# Patient Record
Sex: Male | Born: 1955 | Race: White | Hispanic: No | Marital: Married | Smoking: Current every day smoker
Health system: Southern US, Community
[De-identification: ages and names within clinical notes are randomized; demographics above are authoritative.]

## PROBLEM LIST (undated history)

## (undated) DIAGNOSIS — E785 Hyperlipidemia, unspecified: Secondary | ICD-10-CM

## (undated) DIAGNOSIS — Z91199 Patient's noncompliance with other medical treatment and regimen due to unspecified reason: Secondary | ICD-10-CM

## (undated) DIAGNOSIS — E119 Type 2 diabetes mellitus without complications: Secondary | ICD-10-CM

## (undated) DIAGNOSIS — I2510 Atherosclerotic heart disease of native coronary artery without angina pectoris: Secondary | ICD-10-CM

## (undated) DIAGNOSIS — Z720 Tobacco use: Secondary | ICD-10-CM

## (undated) DIAGNOSIS — I10 Essential (primary) hypertension: Secondary | ICD-10-CM

## (undated) DIAGNOSIS — I2699 Other pulmonary embolism without acute cor pulmonale: Secondary | ICD-10-CM

## (undated) DIAGNOSIS — F419 Anxiety disorder, unspecified: Secondary | ICD-10-CM

## (undated) DIAGNOSIS — M1990 Unspecified osteoarthritis, unspecified site: Secondary | ICD-10-CM

## (undated) DIAGNOSIS — I219 Acute myocardial infarction, unspecified: Secondary | ICD-10-CM

## (undated) HISTORY — DX: Hyperlipidemia, unspecified: E78.5

## (undated) HISTORY — PX: SHOULDER SURGERY: SHX246

## (undated) HISTORY — DX: Unspecified osteoarthritis, unspecified site: M19.90

## (undated) HISTORY — PX: BACK SURGERY: SHX140

## (undated) HISTORY — DX: Atherosclerotic heart disease of native coronary artery without angina pectoris: I25.10

## (undated) HISTORY — PX: COLONOSCOPY: SHX174

## (undated) HISTORY — DX: Tobacco use: Z72.0

## (undated) HISTORY — DX: Patient's noncompliance with other medical treatment and regimen due to unspecified reason: Z91.199

## (undated) HISTORY — DX: Anxiety disorder, unspecified: F41.9

## (undated) HISTORY — PX: REPLACEMENT TOTAL KNEE: SUR1224

## (undated) HISTORY — PX: OTHER SURGICAL HISTORY: SHX169

---

## 2015-11-11 DIAGNOSIS — M5010 Cervical disc disorder with radiculopathy, unspecified cervical region: Secondary | ICD-10-CM | POA: Diagnosis not present

## 2015-11-16 DIAGNOSIS — M0609 Rheumatoid arthritis without rheumatoid factor, multiple sites: Secondary | ICD-10-CM | POA: Diagnosis not present

## 2015-11-30 DIAGNOSIS — M50323 Other cervical disc degeneration at C6-C7 level: Secondary | ICD-10-CM | POA: Diagnosis not present

## 2015-11-30 DIAGNOSIS — M50123 Cervical disc disorder at C6-C7 level with radiculopathy: Secondary | ICD-10-CM | POA: Diagnosis not present

## 2015-12-21 DIAGNOSIS — M0609 Rheumatoid arthritis without rheumatoid factor, multiple sites: Secondary | ICD-10-CM | POA: Diagnosis not present

## 2015-12-22 DIAGNOSIS — M5010 Cervical disc disorder with radiculopathy, unspecified cervical region: Secondary | ICD-10-CM | POA: Diagnosis not present

## 2015-12-22 DIAGNOSIS — M47812 Spondylosis without myelopathy or radiculopathy, cervical region: Secondary | ICD-10-CM | POA: Diagnosis not present

## 2015-12-30 DIAGNOSIS — M0609 Rheumatoid arthritis without rheumatoid factor, multiple sites: Secondary | ICD-10-CM | POA: Diagnosis not present

## 2015-12-30 DIAGNOSIS — M79641 Pain in right hand: Secondary | ICD-10-CM | POA: Diagnosis not present

## 2015-12-30 DIAGNOSIS — S63210A Subluxation of metacarpophalangeal joint of right index finger, initial encounter: Secondary | ICD-10-CM | POA: Diagnosis not present

## 2016-01-12 DIAGNOSIS — M47812 Spondylosis without myelopathy or radiculopathy, cervical region: Secondary | ICD-10-CM | POA: Diagnosis not present

## 2016-01-26 DIAGNOSIS — M47812 Spondylosis without myelopathy or radiculopathy, cervical region: Secondary | ICD-10-CM | POA: Diagnosis not present

## 2016-03-20 DIAGNOSIS — M7989 Other specified soft tissue disorders: Secondary | ICD-10-CM | POA: Diagnosis not present

## 2016-03-20 DIAGNOSIS — S86911A Strain of unspecified muscle(s) and tendon(s) at lower leg level, right leg, initial encounter: Secondary | ICD-10-CM | POA: Diagnosis not present

## 2016-03-22 DIAGNOSIS — Z96651 Presence of right artificial knee joint: Secondary | ICD-10-CM | POA: Diagnosis not present

## 2016-03-26 ENCOUNTER — Emergency Department (HOSPITAL_COMMUNITY)
Admission: EM | Admit: 2016-03-26 | Discharge: 2016-03-26 | Disposition: A | Discharge status: Home / Self Care | Payer: Medicare Other | Attending: Emergency Medicine | Admitting: Emergency Medicine

## 2016-03-26 ENCOUNTER — Emergency Department (HOSPITAL_COMMUNITY): Payer: Medicare Other

## 2016-03-26 ENCOUNTER — Encounter (HOSPITAL_COMMUNITY): Payer: Self-pay | Admitting: Emergency Medicine

## 2016-03-26 DIAGNOSIS — E119 Type 2 diabetes mellitus without complications: Secondary | ICD-10-CM | POA: Insufficient documentation

## 2016-03-26 DIAGNOSIS — J849 Interstitial pulmonary disease, unspecified: Secondary | ICD-10-CM | POA: Diagnosis not present

## 2016-03-26 DIAGNOSIS — J9801 Acute bronchospasm: Secondary | ICD-10-CM | POA: Diagnosis not present

## 2016-03-26 DIAGNOSIS — Z96659 Presence of unspecified artificial knee joint: Secondary | ICD-10-CM | POA: Insufficient documentation

## 2016-03-26 DIAGNOSIS — T17908A Unspecified foreign body in respiratory tract, part unspecified causing other injury, initial encounter: Secondary | ICD-10-CM

## 2016-03-26 DIAGNOSIS — J709 Respiratory conditions due to unspecified external agent: Secondary | ICD-10-CM | POA: Diagnosis not present

## 2016-03-26 DIAGNOSIS — R079 Chest pain, unspecified: Secondary | ICD-10-CM | POA: Diagnosis not present

## 2016-03-26 DIAGNOSIS — R0789 Other chest pain: Secondary | ICD-10-CM | POA: Diagnosis not present

## 2016-03-26 DIAGNOSIS — R0781 Pleurodynia: Secondary | ICD-10-CM | POA: Diagnosis present

## 2016-03-26 DIAGNOSIS — I10 Essential (primary) hypertension: Secondary | ICD-10-CM | POA: Diagnosis not present

## 2016-03-26 DIAGNOSIS — R0989 Other specified symptoms and signs involving the circulatory and respiratory systems: Secondary | ICD-10-CM | POA: Diagnosis not present

## 2016-03-26 DIAGNOSIS — I252 Old myocardial infarction: Secondary | ICD-10-CM | POA: Insufficient documentation

## 2016-03-26 DIAGNOSIS — F17210 Nicotine dependence, cigarettes, uncomplicated: Secondary | ICD-10-CM | POA: Insufficient documentation

## 2016-03-26 DIAGNOSIS — S279XXA Injury of unspecified intrathoracic organ, initial encounter: Secondary | ICD-10-CM | POA: Diagnosis not present

## 2016-03-26 DIAGNOSIS — S2242XA Multiple fractures of ribs, left side, initial encounter for closed fracture: Secondary | ICD-10-CM | POA: Diagnosis not present

## 2016-03-26 HISTORY — DX: Essential (primary) hypertension: I10

## 2016-03-26 HISTORY — DX: Other pulmonary embolism without acute cor pulmonale: I26.99

## 2016-03-26 HISTORY — DX: Acute myocardial infarction, unspecified: I21.9

## 2016-03-26 HISTORY — DX: Type 2 diabetes mellitus without complications: E11.9

## 2016-03-26 LAB — I-STAT CHEM 8, ED
BUN: 15 mg/dL (ref 6–20)
CREATININE: 0.5 mg/dL — AB (ref 0.61–1.24)
Calcium, Ion: 1.16 mmol/L (ref 1.12–1.23)
Chloride: 97 mmol/L — ABNORMAL LOW (ref 101–111)
Glucose, Bld: 552 mg/dL (ref 65–99)
HEMATOCRIT: 36 % — AB (ref 39.0–52.0)
HEMOGLOBIN: 12.2 g/dL — AB (ref 13.0–17.0)
Potassium: 4.5 mmol/L (ref 3.5–5.1)
SODIUM: 134 mmol/L — AB (ref 135–145)
TCO2: 23 mmol/L (ref 0–100)

## 2016-03-26 LAB — CBC
HCT: 34.6 % — ABNORMAL LOW (ref 39.0–52.0)
Hemoglobin: 12.5 g/dL — ABNORMAL LOW (ref 13.0–17.0)
MCH: 32 pg (ref 26.0–34.0)
MCHC: 36.1 g/dL — ABNORMAL HIGH (ref 30.0–36.0)
MCV: 88.5 fL (ref 78.0–100.0)
PLATELETS: 262 10*3/uL (ref 150–400)
RBC: 3.91 MIL/uL — ABNORMAL LOW (ref 4.22–5.81)
RDW: 12.6 % (ref 11.5–15.5)
WBC: 6.6 10*3/uL (ref 4.0–10.5)

## 2016-03-26 MED ORDER — DEXAMETHASONE 4 MG PO TABS: 4.0000 mg | ORAL_TABLET | Freq: Once | ORAL | Status: DC

## 2016-03-26 MED ORDER — ALBUTEROL SULFATE HFA 108 (90 BASE) MCG/ACT IN AERS
2.0000 | INHALATION_SPRAY | RESPIRATORY_TRACT | Status: DC | PRN
  Filled 2016-03-26: qty 6.7

## 2016-03-26 MED ORDER — HYDROMORPHONE HCL 1 MG/ML IJ SOLN
1.0000 mg | Freq: Once | INTRAMUSCULAR | Status: AC
  Administered 2016-03-26: 1 mg via INTRAVENOUS
  Filled 2016-03-26: qty 1

## 2016-03-26 MED ORDER — INSULIN ASPART 100 UNIT/ML ~~LOC~~ SOLN
10.0000 [IU] | Freq: Once | SUBCUTANEOUS | Status: AC
  Administered 2016-03-26: 10 [IU] via INTRAVENOUS
  Filled 2016-03-26: qty 1

## 2016-03-26 MED ORDER — HYDROMORPHONE HCL 1 MG/ML IJ SOLN
1.0000 mg | INTRAMUSCULAR | Status: DC | PRN
  Administered 2016-03-26: 1 mg via INTRAVENOUS
  Filled 2016-03-26: qty 1

## 2016-03-26 MED ORDER — OXYCODONE-ACETAMINOPHEN 5-325 MG PO TABS: 1.0000 | ORAL_TABLET | ORAL | Status: DC | PRN

## 2016-03-26 MED ORDER — IOPAMIDOL (ISOVUE-300) INJECTION 61%
75.0000 mL | Freq: Once | INTRAVENOUS | Status: AC | PRN
  Administered 2016-03-26: 75 mL via INTRAVENOUS

## 2016-03-26 MED ORDER — DEXAMETHASONE SODIUM PHOSPHATE 10 MG/ML IJ SOLN
10.0000 mg | Freq: Once | INTRAMUSCULAR | Status: AC
  Administered 2016-03-26: 10 mg via INTRAVENOUS
  Filled 2016-03-26: qty 1

## 2016-03-26 MED ORDER — LEVOFLOXACIN 500 MG PO TABS: 500.0000 mg | ORAL_TABLET | Freq: Every day | ORAL | Status: DC

## 2016-03-26 MED ORDER — IBUPROFEN 600 MG PO TABS: 600.0000 mg | ORAL_TABLET | Freq: Three times a day (TID) | ORAL | Status: DC | PRN

## 2016-03-26 MED ORDER — LEVOFLOXACIN 500 MG PO TABS
500.0000 mg | ORAL_TABLET | Freq: Once | ORAL | Status: AC
  Administered 2016-03-26: 500 mg via ORAL
  Filled 2016-03-26: qty 1

## 2016-03-26 MED ORDER — ALBUTEROL SULFATE (2.5 MG/3ML) 0.083% IN NEBU
5.0000 mg | INHALATION_SOLUTION | Freq: Once | RESPIRATORY_TRACT | Status: AC
  Administered 2016-03-26: 5 mg via RESPIRATORY_TRACT
  Filled 2016-03-26: qty 6

## 2016-03-26 NOTE — ED Notes (Signed)
Per EMS patient comes from [facility] for left ribcage pain.  Patient aspirated on cereal this am.  Patient aggravated old injury from a fall few days ago when coughing. Pt brought Chest and Rib CD from urgent care.

## 2016-03-26 NOTE — Discharge Instructions (Signed)
Bronchospasm, Adult  A bronchospasm is a spasm or tightening of the airways going into the lungs. During a bronchospasm breathing becomes more difficult because the airways get smaller. When this happens there can be coughing, a whistling sound when breathing (wheezing), and difficulty breathing. Bronchospasm is often associated with asthma, but not all patients who experience a bronchospasm have asthma.  CAUSES   A bronchospasm is caused by inflammation or irritation of the airways. The inflammation or irritation may be triggered by:   · Allergies (such as to animals, pollen, food, or mold). Allergens that cause bronchospasm may cause wheezing immediately after exposure or many hours later.    · Infection. Viral infections are believed to be the most common cause of bronchospasm.    · Exercise.    · Irritants (such as pollution, cigarette smoke, strong odors, aerosol sprays, and paint fumes).    · Weather changes. Winds increase molds and pollens in the air. Rain refreshes the air by washing irritants out. Cold air may cause inflammation.    · Stress and emotional upset.    SIGNS AND SYMPTOMS   · Wheezing.    · Excessive nighttime coughing.    · Frequent or severe coughing with a simple cold.    · Chest tightness.    · Shortness of breath.    DIAGNOSIS   Bronchospasm is usually diagnosed through a history and physical exam. Tests, such as chest X-rays, are sometimes done to look for other conditions.  TREATMENT   · Inhaled medicines can be given to open up your airways and help you breathe. The medicines can be given using either an inhaler or a nebulizer machine.  · Corticosteroid medicines may be given for severe bronchospasm, usually when it is associated with asthma.  HOME CARE INSTRUCTIONS   · Always have a plan prepared for seeking medical care. Know when to call your health care provider and local emergency services (911 in the [place] Know where you can access local emergency care.  · Only take medicines as  directed by your health care provider.  · If you were prescribed an inhaler or nebulizer machine, ask your health care provider to explain how to use it correctly. Always use a spacer with your inhaler if you were given one.  · It is necessary to remain calm during an attack. Try to relax and breathe more slowly.   · Control your home environment in the following ways:      Change your heating and air conditioning filter at least once a month.      Limit your use of fireplaces and wood stoves.    Do not smoke and do not allow smoking in your home.      Avoid exposure to perfumes and fragrances.      Get rid of pests (such as roaches and mice) and their droppings.      Throw away plants if you see mold on them.      Keep your house clean and dust free.      Replace carpet with wood, tile, or vinyl flooring. Carpet can trap dander and dust.      Use allergy-proof pillows, mattress covers, and box spring covers.      Wash bed sheets and blankets every week in hot water and dry them in a dryer.      Use blankets that are made of polyester or cotton.      Wash hands frequently.  SEEK MEDICAL CARE IF:   · You have muscle aches.    · You have chest pain.    · The sputum changes from clear or   white to yellow, green, gray, or bloody.    · The sputum you cough up gets thicker.    · There are problems that may be related to the medicine you are given, such as a rash, itching, swelling, or trouble breathing.    SEEK IMMEDIATE MEDICAL CARE IF:   · You have worsening wheezing and coughing even after taking your prescribed medicines.    · You have increased difficulty breathing.    · You develop severe chest pain.  MAKE SURE YOU:   · Understand these instructions.  · Will watch your condition.  · Will get help right away if you are not doing well or get worse.     This information is not intended to replace advice given to you by your health care provider. Make sure you discuss any questions you have with your health care  provider.     Document Released: 10/19/2003 Document Revised: 11/06/2014 Document Reviewed: 04/07/2013  Elsevier Interactive Patient Education ©2016 [employer]

## 2016-03-26 NOTE — ED Provider Notes (Signed)
CSN: [id number]     Arrival date & time 03/26/16  1215 History   First MD Initiated Contact with Patient 03/26/16 1236     Chief Complaint  Patient presents with  . rib cage pain       HPI Patient presents to the emergency department with complaints of left lateral chest pain worse with coughing and movement since choking on his cereal this morning.  He thinks he may have aspirated some of his morning cereal.  He reports no shortness of breath at this time but reports severe pain in his left lateral chest when he coughs or moves.  He was seen in urgent care and they suspected a left-sided rib fracture and thus just sent him to the ER for evaluation.  Patient continues to smoke cigarettes.  He does have a history of pulmonary and wasn't but is no longer on anticoagulants.  He denies unilateral leg swelling.  He reports no shortness of breath.  He did fall several days ago resulting in him landing on his back on his back and suspect that he may have initially injured his left lateral chest at that time during the fall as he had some mild pain then.  Since his severe episode of coughing this morning he feels as though he exacerbated his left lateral chest pain.   Past Medical History  Diagnosis Date  . PE (pulmonary embolism)   . MI (myocardial infarction) (HCC)   . Hypertension   . Diabetes mellitus without complication [facility])    Past Surgical History  Procedure Laterality Date  . Replacement total knee    . Neck fusion    . Back surgery    . Shoulder surgery     No family history on file. Social History  Substance Use Topics  . Smoking status: Current Every Day Smoker    Types: Cigarettes  . Smokeless tobacco: None  . Alcohol Use: No    Review of Systems  All other systems reviewed and are negative.     Allergies  Review of patient's allergies indicates no known allergies.  Home Medications   Prior to Admission medications   Not on File   BP 112/77 mmHg  Pulse 67   Temp(Src) 98.1 F (36.7 C) (Oral)  Resp 18  SpO2 99% Physical Exam  Constitutional: He is oriented to person, place, and time. He appears well-developed and well-nourished.  HENT:  Head: Normocephalic and atraumatic.  Eyes: EOM are normal.  Neck: Normal range of motion.  Cardiovascular: Normal rate, regular rhythm, normal heart sounds and intact distal pulses.   Pulmonary/Chest: Effort normal and breath sounds normal. No respiratory distress.  Left lateral chest wall tenderness  Abdominal: Soft. He exhibits no distension. There is no tenderness.  Musculoskeletal: Normal range of motion.  Neurological: He is alert and oriented to person, place, and time.  Skin: Skin is warm and dry.  Psychiatric: He has a normal mood and affect. Judgment normal.  Nursing note and vitals reviewed.   ED Course  Procedures (including critical care time) Labs Review Labs Reviewed  CBC - Abnormal; Notable for the following:    RBC 3.91 (*)    Hemoglobin 12.5 (*)    HCT 34.6 (*)    MCHC 36.1 (*)    All other components within normal limits  I-STAT CHEM 8, ED - Abnormal; Notable for the following:    Sodium 134 (*)    Chloride 97 (*)    Creatinine, Ser 0.50 (*)  Glucose, Bld 552 (*)    Hemoglobin 12.2 (*)    HCT 36.0 (*)    All other components within normal limits    Imaging Review Dg Chest 2 View  03/26/2016  CLINICAL DATA:  Patient status post aspiration. EXAM: CHEST  2 VIEW COMPARISON:  None. FINDINGS: Normal cardiac contours status post median sternotomy. Anterior cervical spinal fusion hardware. There is a 7 cm oval circumscribed mass projecting anterior to the mediastinum on lateral view and overlying the right hilum on the frontal view. No consolidative pulmonary opacities. No pleural effusion or pneumothorax. Multilevel thoracic spine degenerative changes. IMPRESSION: Nonspecific 7 cm anterior mediastinal mass. This needs dedicated evaluation with contrast-enhanced chest CT. No acute  cardiopulmonary process. Electronically Signed   By: Annia Belt M.D.   On: 03/26/2016 13:51   Ct Chest W Contrast  03/26/2016  CLINICAL DATA:  60 year old male with abnormal chest CT concerning for potential anterior mediastinal mass. EXAM: CT CHEST WITH CONTRAST TECHNIQUE: Multidetector CT imaging of the chest was performed during intravenous contrast administration. CONTRAST:  85mL ISOVUE-300 IOPAMIDOL (ISOVUE-300) INJECTION 61% COMPARISON:  No priors.  Chest x-ray 03/26/2016. FINDINGS: Mediastinum/Lymph Nodes: Heart size is borderline enlarged. There is no significant pericardial fluid, thickening or pericardial calcification. There is atherosclerosis of the thoracic aorta, the great vessels of the mediastinum and the coronary arteries, including calcified atherosclerotic plaque in the left anterior descending, left circumflex and right coronary arteries. Status post median sternotomy for CABG, including LIMA to the LAD. No pathologically enlarged mediastinal or hilar lymph nodes. Finding on the recent chest radiograph corresponds to a fatty attenuation lesion in the right-side of the anterior mediastinum which measures up to 4.3 x 4.1 x 4.5 cm. This is associated with some surgical clips and an abandoned epicardial pacing lead, and is favored to represent an area of postoperative fat necrosis in the anterior mediastinum. Esophagus is unremarkable in appearance. No axillary lymphadenopathy. Lungs/Pleura: Mild diffuse centrilobular ground-glass attenuation micronodularity, suggestive of a smoking related disease. No larger more suspicious appearing pulmonary nodules or masses. No acute consolidative airspace disease. Mild diffuse bronchial thickening with mild paraseptal emphysema. Upper Abdomen: 7 mm partially calcified gallstone lying dependently in the neck of the gallbladder. Musculoskeletal/Soft Tissues: Median sternotomy wires. There are no aggressive appearing lytic or blastic lesions noted in the  visualized portions of the skeleton. IMPRESSION: 1. Finding on the recent chest radiograph corresponds to a benign-appearing fatty attenuation lesion in the right-sided the anterior mediastinum. This has a benign appearance, which is most compatible with an area of fat necrosis likely related to prior surgery and instrumentation. 2. Mild diffuse centrilobular ground-glass attenuation micronodularity throughout the lungs bilaterally, suggestive of a smoking related disease such as respiratory bronchiolitis (RB), or if the patient is symptomatic, respiratory bronchiolitis interstitial lung disease (RB-ILD). 3. Mild diffuse bronchial thickening with mild paraseptal emphysema; imaging findings suggestive of underlying COPD. 4. Atherosclerosis, including three-vessel coronary artery disease. Status post median sternotomy for CABG, including LIMA to the LAD. Electronically Signed   By: Trudie Reed M.D.   On: 03/26/2016 15:19   I have personally reviewed and evaluated these images and lab results as part of my medical decision-making.    MDM   Final diagnoses:  Bronchospasm  Aspiration into airway, initial encounter  Acute chest wall pain    3:45 PM Patient feels much better at this time.  He is focally tender in his left lateral chest without obvious deformity.  He was extremely bronchospastic on arrival and  improved with bronchodilators.  His pain improved as well.  He may have some degree of aspiration pneumonitis given this event.  No obvious aspiration pneumonia noted at this time.  No hypoxia.  Abnormal chest x-ray initially with a new mediastinal mass although on CT imaging of his chest with contrast this was felt to be secondary to fatty infiltration.  He will follow-up with his doctor further.  From a radiographic standpoint this appears to be benign.  Patient understands return to the ER for new or worsening symptoms.  He feels much better at this time and would like to go home.    Azalia Bilis, MD 03/26/16 [phone]

## 2016-03-26 NOTE — ED Notes (Signed)
Patient transported to CT 

## 2016-03-26 NOTE — ED Notes (Signed)
Patient added that xray results show 2 broken ribs.  Patient fell on MOnday.

## 2016-03-26 NOTE — ED Notes (Signed)
Made respiratory aware of neb treatment ordered 

## 2016-03-26 NOTE — ED Notes (Signed)
MD at bedside. 

## 2016-03-26 NOTE — ED Notes (Signed)
Pt independent and ambulatory at discharge.  

## 2016-03-30 DIAGNOSIS — R2689 Other abnormalities of gait and mobility: Secondary | ICD-10-CM | POA: Diagnosis not present

## 2016-03-30 DIAGNOSIS — M25561 Pain in right knee: Secondary | ICD-10-CM | POA: Diagnosis not present

## 2016-03-30 DIAGNOSIS — M6281 Muscle weakness (generalized): Secondary | ICD-10-CM | POA: Diagnosis not present

## 2016-03-30 DIAGNOSIS — M25661 Stiffness of right knee, not elsewhere classified: Secondary | ICD-10-CM | POA: Diagnosis not present

## 2016-04-20 DIAGNOSIS — M069 Rheumatoid arthritis, unspecified: Secondary | ICD-10-CM | POA: Diagnosis not present

## 2016-04-20 DIAGNOSIS — E782 Mixed hyperlipidemia: Secondary | ICD-10-CM | POA: Diagnosis not present

## 2016-04-20 DIAGNOSIS — I10 Essential (primary) hypertension: Secondary | ICD-10-CM | POA: Diagnosis not present

## 2016-04-20 DIAGNOSIS — M25561 Pain in right knee: Secondary | ICD-10-CM | POA: Diagnosis not present

## 2016-04-20 DIAGNOSIS — E119 Type 2 diabetes mellitus without complications: Secondary | ICD-10-CM | POA: Diagnosis not present

## 2016-04-20 DIAGNOSIS — M5090 Cervical disc disorder, unspecified, unspecified cervical region: Secondary | ICD-10-CM | POA: Diagnosis not present

## 2016-05-12 DIAGNOSIS — Z96651 Presence of right artificial knee joint: Secondary | ICD-10-CM | POA: Diagnosis not present

## 2016-05-12 DIAGNOSIS — M25561 Pain in right knee: Secondary | ICD-10-CM | POA: Diagnosis not present

## 2016-05-25 DIAGNOSIS — M25561 Pain in right knee: Secondary | ICD-10-CM | POA: Diagnosis not present

## 2016-05-25 DIAGNOSIS — F339 Major depressive disorder, recurrent, unspecified: Secondary | ICD-10-CM | POA: Diagnosis not present

## 2016-05-31 DIAGNOSIS — M1711 Unilateral primary osteoarthritis, right knee: Secondary | ICD-10-CM | POA: Diagnosis not present

## 2016-05-31 DIAGNOSIS — S76111A Strain of right quadriceps muscle, fascia and tendon, initial encounter: Secondary | ICD-10-CM | POA: Diagnosis not present

## 2016-05-31 DIAGNOSIS — Z96651 Presence of right artificial knee joint: Secondary | ICD-10-CM | POA: Diagnosis not present

## 2016-08-27 ENCOUNTER — Encounter (HOSPITAL_COMMUNITY): Payer: Self-pay | Admitting: Emergency Medicine

## 2016-08-27 ENCOUNTER — Emergency Department (HOSPITAL_COMMUNITY)
Admission: EM | Admit: 2016-08-27 | Discharge: 2016-08-28 | Disposition: A | Discharge status: Home / Self Care | Payer: Medicare Other | Attending: Emergency Medicine | Admitting: Emergency Medicine

## 2016-08-27 DIAGNOSIS — K611 Rectal abscess: Secondary | ICD-10-CM | POA: Diagnosis not present

## 2016-08-27 DIAGNOSIS — F17210 Nicotine dependence, cigarettes, uncomplicated: Secondary | ICD-10-CM | POA: Insufficient documentation

## 2016-08-27 DIAGNOSIS — E119 Type 2 diabetes mellitus without complications: Secondary | ICD-10-CM | POA: Insufficient documentation

## 2016-08-27 DIAGNOSIS — I10 Essential (primary) hypertension: Secondary | ICD-10-CM | POA: Insufficient documentation

## 2016-08-27 DIAGNOSIS — I252 Old myocardial infarction: Secondary | ICD-10-CM | POA: Diagnosis not present

## 2016-08-27 MED ORDER — LIDOCAINE HCL 2 % IJ SOLN: 10.0000 mL | Freq: Once | INTRAMUSCULAR | Status: DC

## 2016-08-27 MED ORDER — OXYCODONE HCL 5 MG PO TABS
5.0000 mg | ORAL_TABLET | Freq: Once | ORAL | Status: AC
  Administered 2016-08-27: 5 mg via ORAL
  Filled 2016-08-27: qty 1

## 2016-08-27 MED ORDER — LIDOCAINE-EPINEPHRINE (PF) 2 %-1:200000 IJ SOLN
10.0000 mL | Freq: Once | INTRAMUSCULAR | Status: AC
  Administered 2016-08-27: 10 mL
  Filled 2016-08-27: qty 10

## 2016-08-27 MED ORDER — LIDOCAINE-EPINEPHRINE (PF) 2 %-1:200000 IJ SOLN: 10.0000 mL | Freq: Once | INTRAMUSCULAR | Status: DC

## 2016-08-27 NOTE — ED Triage Notes (Signed)
Pt has history of rectal abscesses that have required surgical I & D in the past.  For the past month has had recurrence of a cyst that has drained periodically and in the past few days has grown significantly and soreness and pain is significantly worse.

## 2016-08-27 NOTE — ED Provider Notes (Signed)
[facility]-EMERGENCY DEPT Provider Note   CSN: [id number] Arrival date & time: 08/27/16  1901     History   Chief Complaint Chief Complaint  Patient presents with  . Abscess    HPI Andrew Russo is a 60 y.o. male.  The history is provided by the patient.  Abscess  Location:  Pelvis Pelvic abscess location:  Perineum Size:  2 cm Abscess quality: fluctuance, induration and painful   Duration:  3 days Progression:  Worsening Pain details:    Quality:  Dull and pressure   Severity:  Moderate   Duration:  2 days   Timing:  Constant   Progression:  Unchanged Chronicity:  New Context: diabetes   Relieved by:  Nothing Worsened by:  Nothing Ineffective treatments:  None tried Associated symptoms: no fever, no nausea and no vomiting   Risk factors: prior abscess     Past Medical History:  Diagnosis Date  . Diabetes mellitus without complication (HCC)   . Hypertension   . MI (myocardial infarction)   . PE (pulmonary embolism)     There are no active problems to display for this patient.   Past Surgical History:  Procedure Laterality Date  . BACK SURGERY    . neck fusion    . REPLACEMENT TOTAL KNEE    . SHOULDER SURGERY         Home Medications    Prior to Admission medications   Medication Sig Start Date End Date Taking? Authorizing Provider  ibuprofen (ADVIL,MOTRIN) 600 MG tablet Take 1 tablet (600 mg total) by mouth every 8 (eight) hours as needed. 03/26/16   Azalia Bilis, MD  levofloxacin (LEVAQUIN) 500 MG tablet Take 1 tablet (500 mg total) by mouth daily. 03/26/16   Azalia Bilis, MD  oxyCODONE-acetaminophen (PERCOCET/ROXICET) 5-325 MG tablet Take 1 tablet by mouth every 4 (four) hours as needed for severe pain. 03/26/16   Azalia Bilis, MD    Family History No family history on file.  Social History Social History  Substance Use Topics  . Smoking status: Current Every Day Smoker    Packs/day: 0.50    Types: Cigarettes  . Smokeless tobacco: Never  Used  . Alcohol use No     Allergies   Review of patient's allergies indicates no known allergies.   Review of Systems Review of Systems  Constitutional: Negative for fever.  Gastrointestinal: Negative for nausea and vomiting.  All other systems reviewed and are negative.    Physical Exam Updated Vital Signs BP 124/74   Pulse 68   Temp 98.4 F (36.9 C) (Oral)   Resp 12   SpO2 94%   Physical Exam  Constitutional: He is oriented to person, place, and time. He appears well-developed and well-nourished. No distress.  HENT:  Head: Normocephalic and atraumatic.  Nose: Nose normal.  Eyes: Conjunctivae are normal.  Neck: Neck supple. No tracheal deviation present.  Cardiovascular: Normal rate and regular rhythm.   Pulmonary/Chest: Effort normal. No respiratory distress.  Abdominal: Soft. He exhibits no distension.  Genitourinary: Rectum normal.  Genitourinary Comments: 2 cm indurated area anterior to rectum overlying perineum with tenderness  Neurological: He is alert and oriented to person, place, and time.  Skin: Skin is warm and dry.  Psychiatric: He has a normal mood and affect.     ED Treatments / Results  Labs (all labs ordered are listed, but only abnormal results are displayed) Labs Reviewed - No data to display  EKG  EKG Interpretation None  Radiology No results found.  Procedures Procedures (including critical care time)  INCISION AND DRAINAGE Performed by: Lyndal Pulley Consent: Verbal consent obtained. Risks and benefits: risks, benefits and alternatives were discussed Type: abscess  Body area: anterior peirectal area over perineum  Anesthesia: local infiltration  Incision was made with a scalpel.  Local anesthetic: lidocaine 2% w epinephrine  Anesthetic total: 8 ml  Complexity: complex Blunt dissection to break up loculations  Drainage: purulent  Drainage amount: moderate  Packing material: none  Patient tolerance:  Patient tolerated the procedure well with no immediate complications.    Medications Ordered in ED Medications - No data to display   Initial Impression / Assessment and Plan / ED Course  I have reviewed the triage vital signs and the nursing notes.  Pertinent labs & imaging results that were available during my care of the patient were reviewed by me and considered in my medical decision making (see chart for details).  Clinical Course    Patient presents with abscess. Located over perineum perirectally. Has history of same. Drained as above with good relief of pressure after local anesthesia. No overlying cellulitis appreciated on exam and no indication for antibiotics at this time. Wound left open to drain and return precautions discussed for wound recheck.    Final Clinical Impressions(s) / ED Diagnoses   Final diagnoses:  Perirectal abscess    New Prescriptions New Prescriptions   No medications on file     Lyndal Pulley, MD 08/28/16 0150

## 2016-08-27 NOTE — ED Notes (Signed)
Pt presents with bean-sized rectal abscess that has been recurring for 3 weeks and worsened on Friday.  Pain is 7/10.  10/10 with movement.  Abscess was draining on its own without a visible source of drainage.  Pt sts he has a primary care MD at this time but may switch.

## 2016-08-29 DIAGNOSIS — Z6826 Body mass index (BMI) 26.0-26.9, adult: Secondary | ICD-10-CM | POA: Diagnosis not present

## 2016-08-29 DIAGNOSIS — L0231 Cutaneous abscess of buttock: Secondary | ICD-10-CM | POA: Diagnosis not present

## 2016-09-01 DIAGNOSIS — L0231 Cutaneous abscess of buttock: Secondary | ICD-10-CM | POA: Diagnosis not present

## 2016-09-01 DIAGNOSIS — Z6827 Body mass index (BMI) 27.0-27.9, adult: Secondary | ICD-10-CM | POA: Diagnosis not present

## 2016-11-14 DIAGNOSIS — E119 Type 2 diabetes mellitus without complications: Secondary | ICD-10-CM | POA: Diagnosis not present

## 2016-11-14 DIAGNOSIS — I2510 Atherosclerotic heart disease of native coronary artery without angina pectoris: Secondary | ICD-10-CM | POA: Diagnosis not present

## 2016-11-14 DIAGNOSIS — M129 Arthropathy, unspecified: Secondary | ICD-10-CM | POA: Diagnosis not present

## 2016-11-14 DIAGNOSIS — Z23 Encounter for immunization: Secondary | ICD-10-CM | POA: Diagnosis not present

## 2016-11-22 DIAGNOSIS — M059 Rheumatoid arthritis with rheumatoid factor, unspecified: Secondary | ICD-10-CM | POA: Diagnosis not present

## 2016-12-20 IMAGING — CR DG CHEST 2V
2 series · 2 of 2 positions shown · non-contrast
Comparison: None.

CLINICAL DATA: Patient status post aspiration.

EXAM:
CHEST  2 VIEW

[w chest pa]
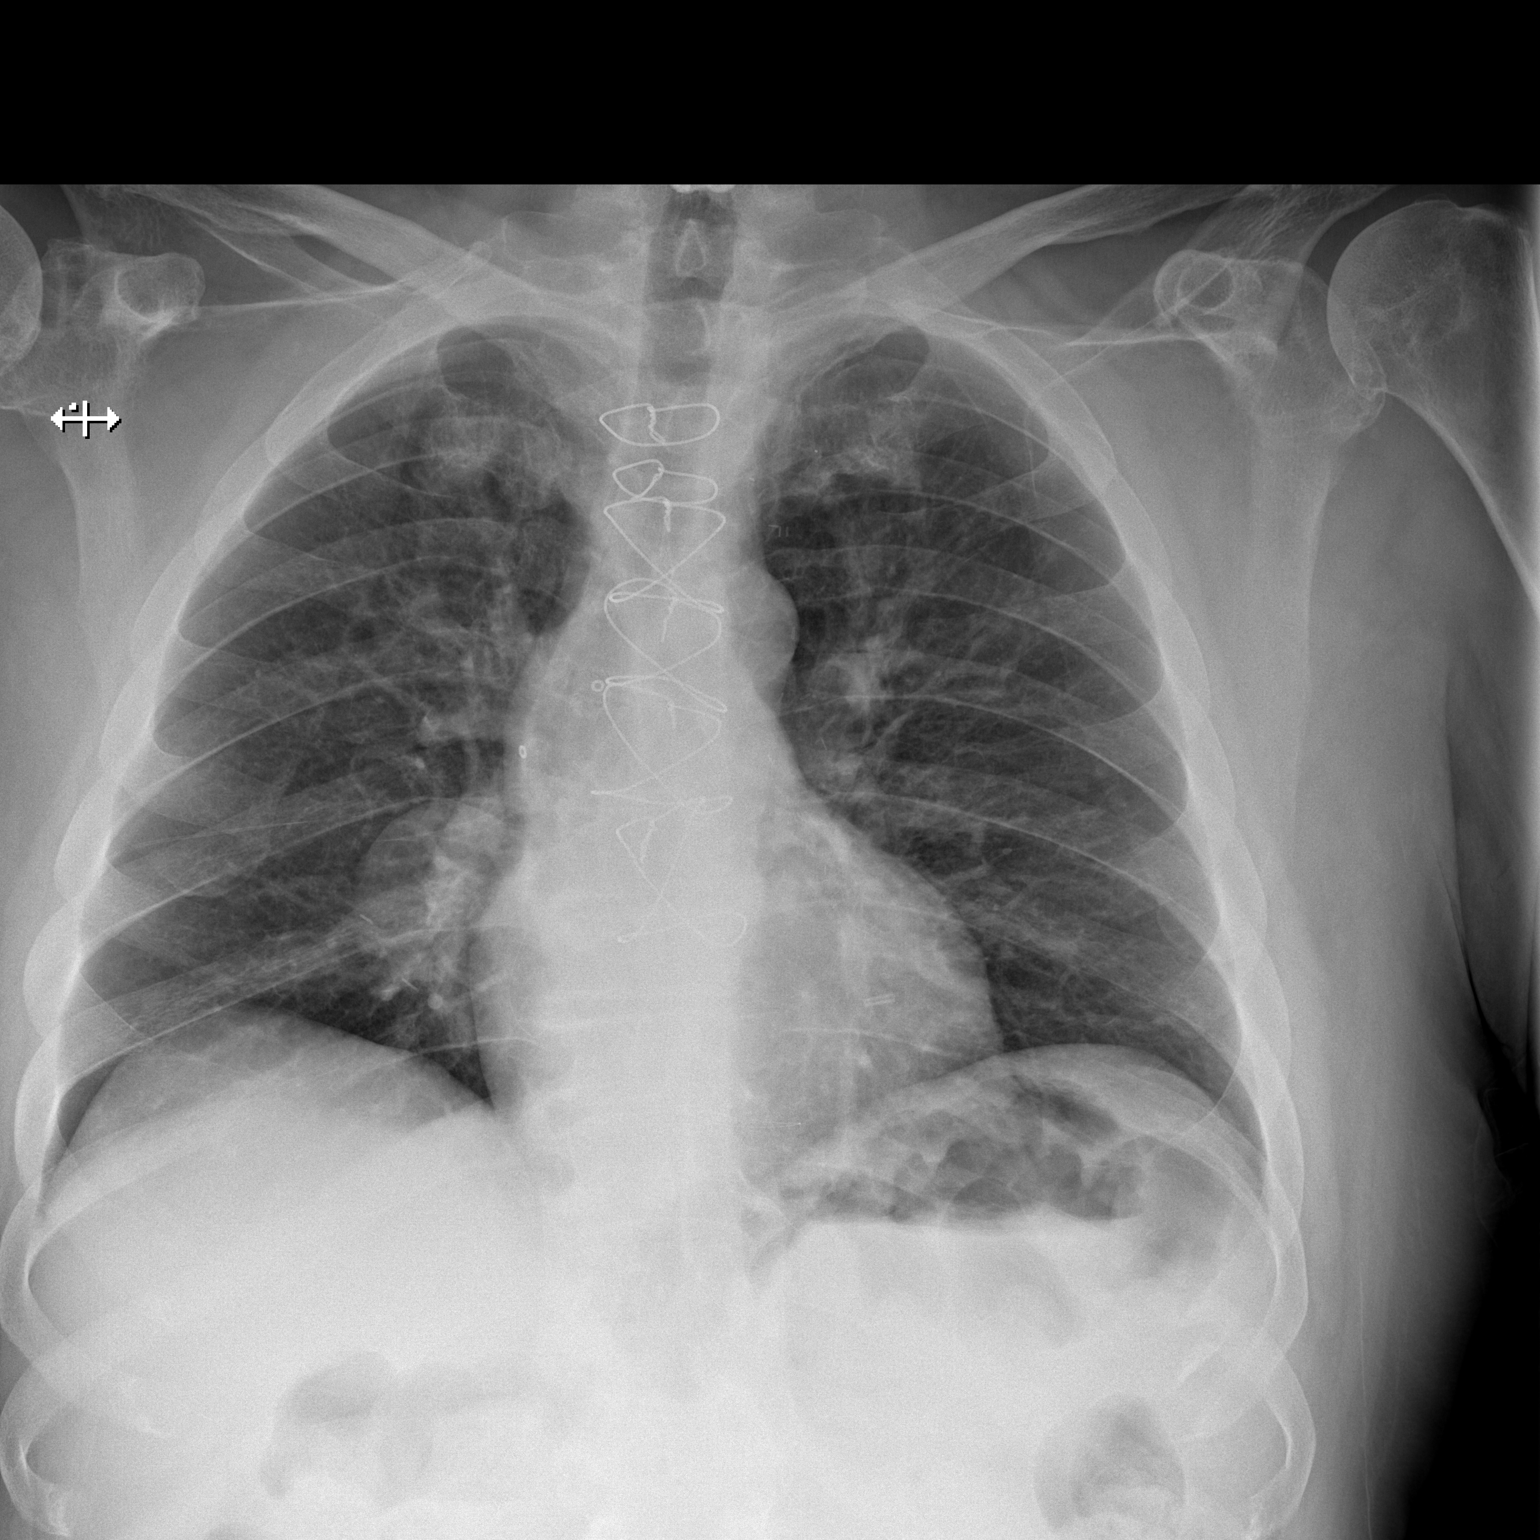

[w chest lat]
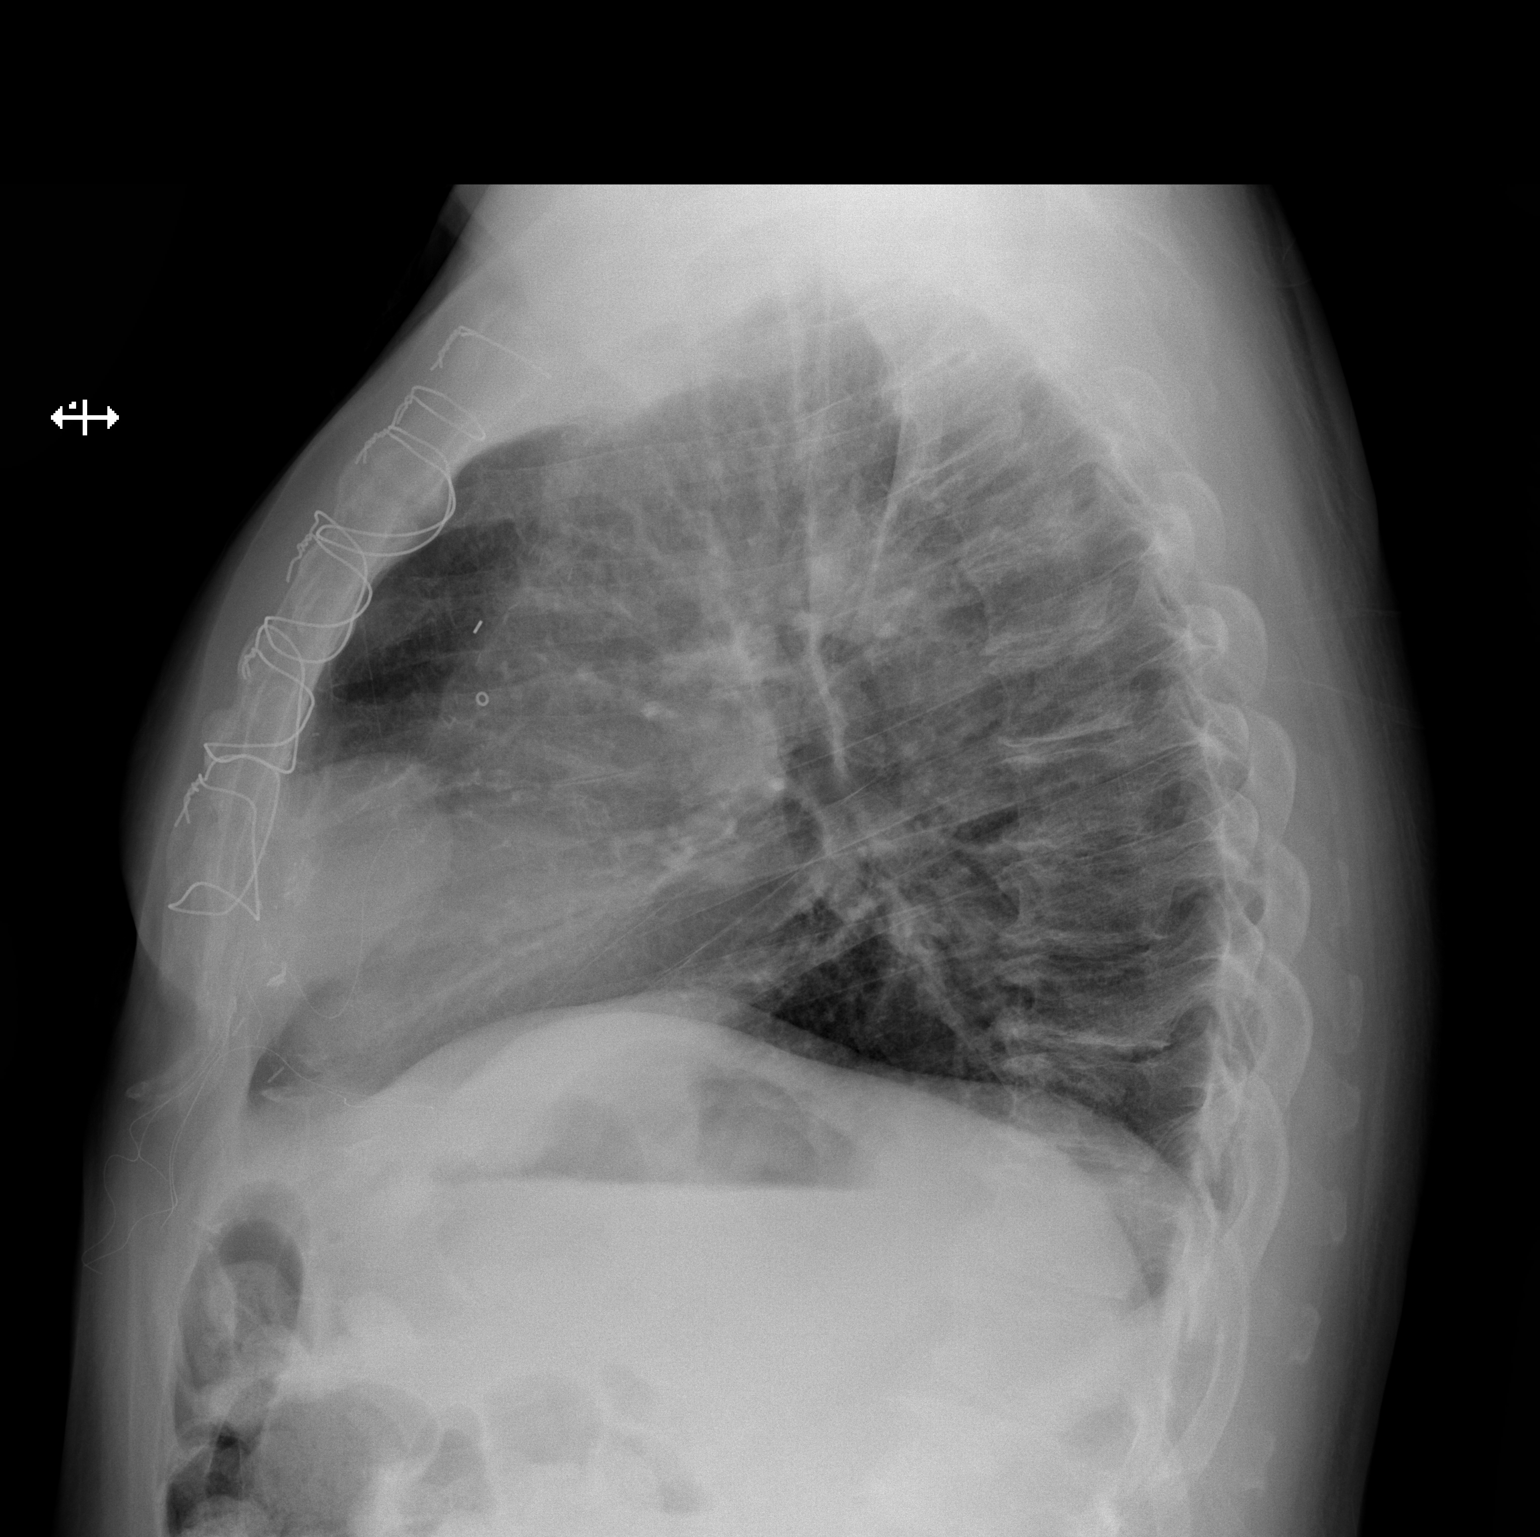

[2 of 2 positions shown; findings below may reference images not displayed]

FINDINGS: Normal cardiac contours status post median sternotomy. Anterior
cervical spinal fusion hardware. There is a 7 cm oval circumscribed
mass projecting anterior to the mediastinum on lateral view and
overlying the right hilum on the frontal view. No consolidative
pulmonary opacities. No pleural effusion or pneumothorax. Multilevel
thoracic spine degenerative changes.
IMPRESSION: Nonspecific 7 cm anterior mediastinal mass. This needs dedicated
evaluation with contrast-enhanced chest CT.

No acute cardiopulmonary process.

## 2016-12-27 IMAGING — CT CT CHEST W/ CM
2 of 4 series · 12 of 36 positions shown, 15 images · IV contrast (ISOVUE)
Comparison: No priors.  Chest x-ray 03/26/2016.

CLINICAL DATA: 59-year-old male with abnormal chest CT concerning
for potential anterior mediastinal mass.

EXAM:
CT CHEST WITH CONTRAST
TECHNIQUE: Multidetector CT imaging of the chest was performed during
intravenous contrast administration.
CONTRAST:  75mL 2AJBCJ-CEE IOPAMIDOL (2AJBCJ-CEE) INJECTION 61%

[Series 2: chest with st · axial · 0.73mm/px · z∈[-301,-61]mm · 9 of 101 slices shown, 12 images]
[im 11/101  mediastinal]
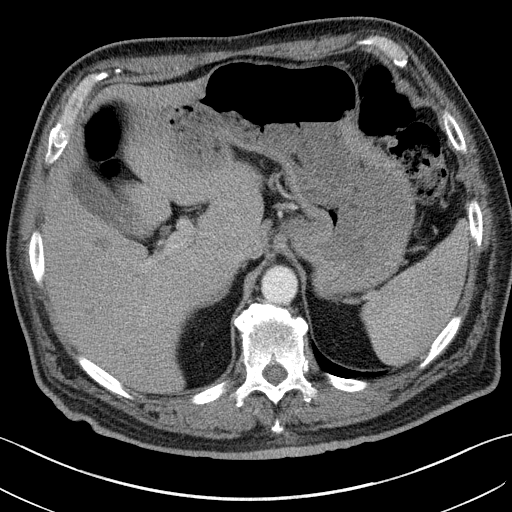
[im 11/101  lung]
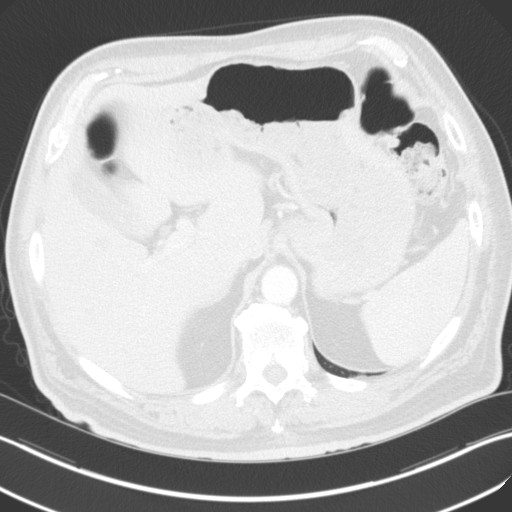
[im 21/101  lung]
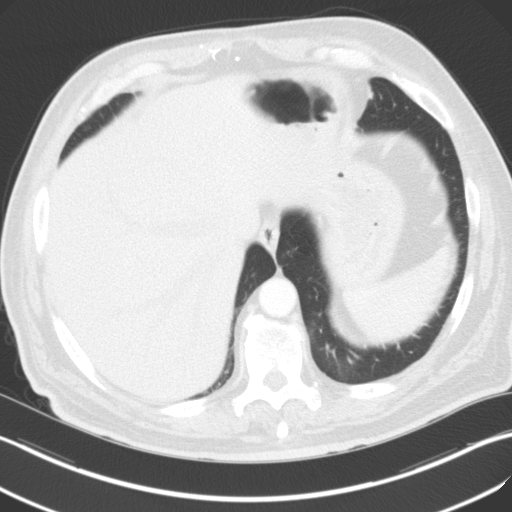
[im 31/101  lung]
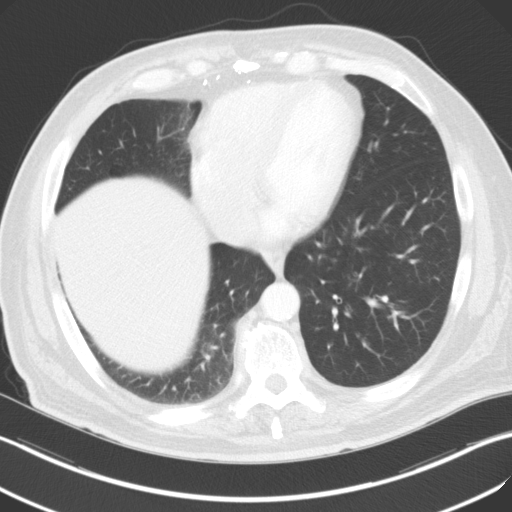
[im 41/101  lung]
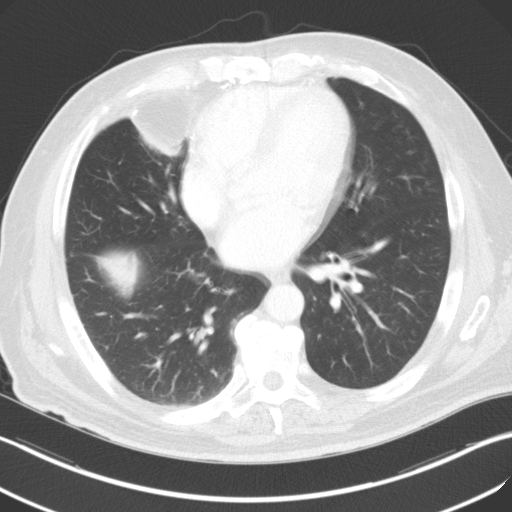
[im 51/101  mediastinal]
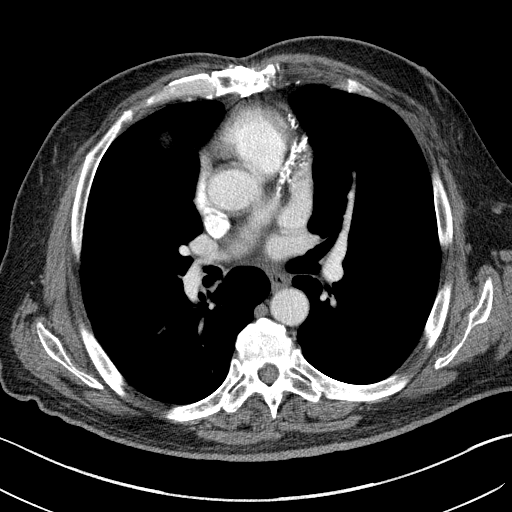
[im 51/101  lung]
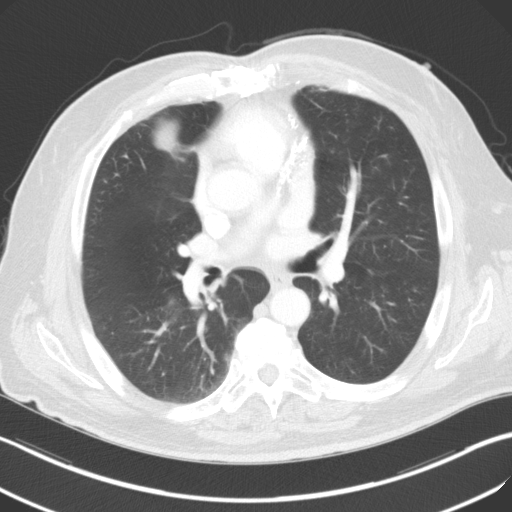
[im 61/101  lung]
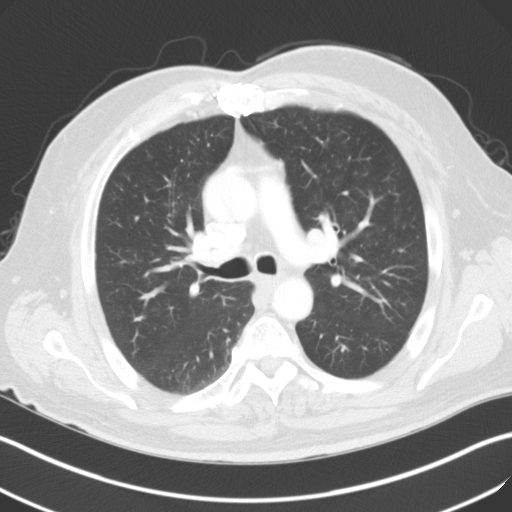
[im 71/101  lung]
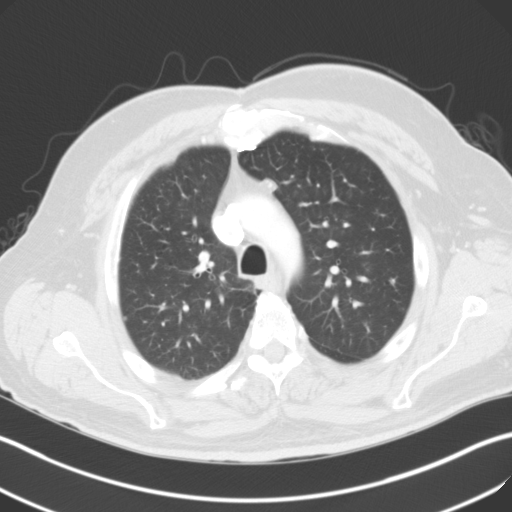
[im 81/101  lung]
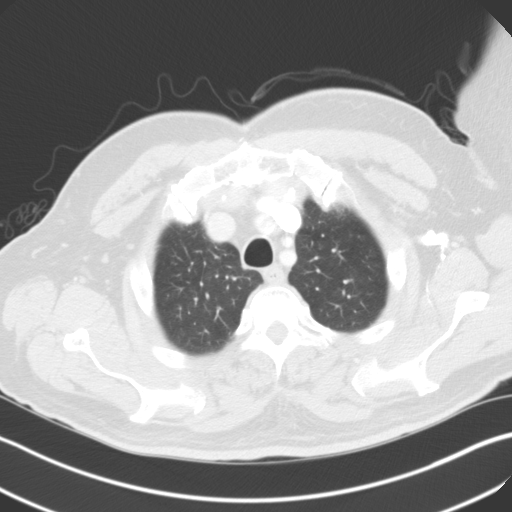
[im 91/101  mediastinal]
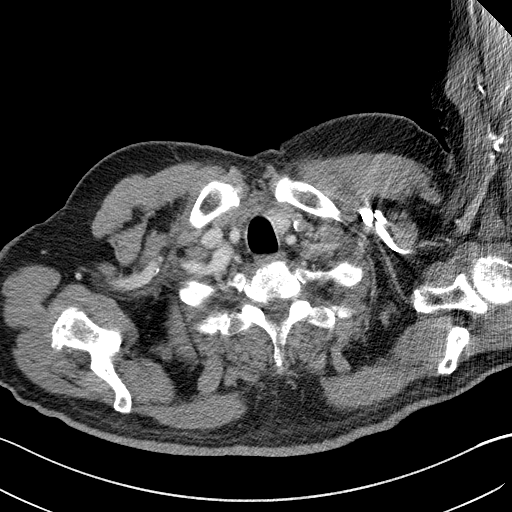
[im 91/101  lung]
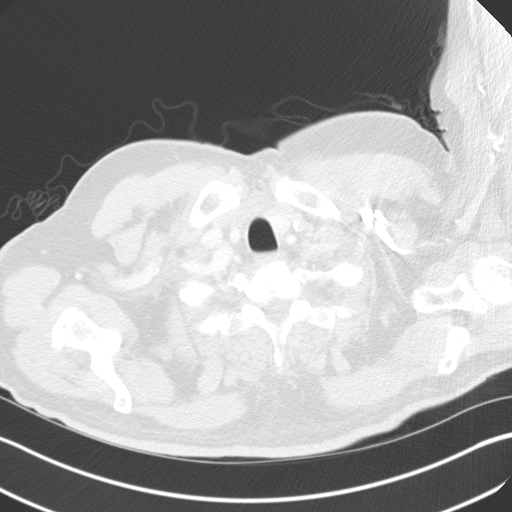

[Series 6: coronals · coronal · 0.63mm/px · 3 of 165 slices shown]
[im 33/165  lung]
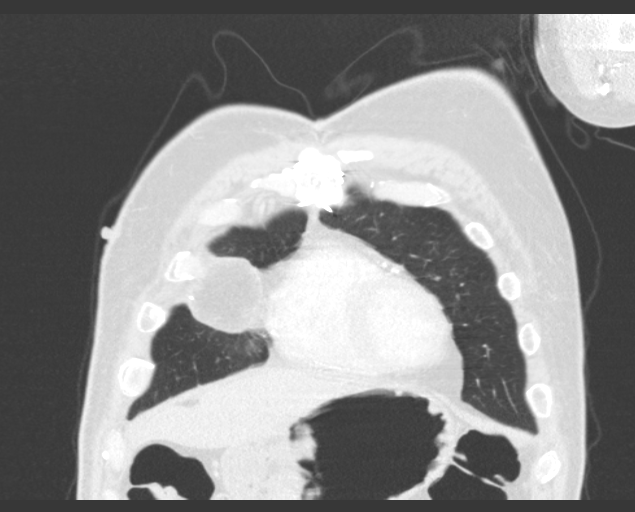
[im 66/165  lung]
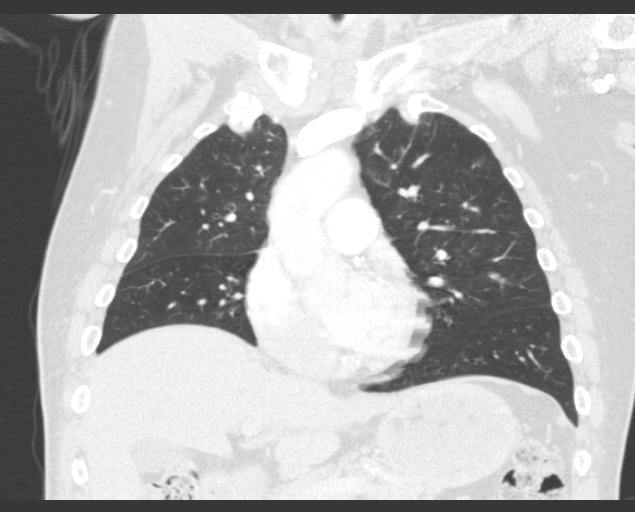
[im 99/165  lung]
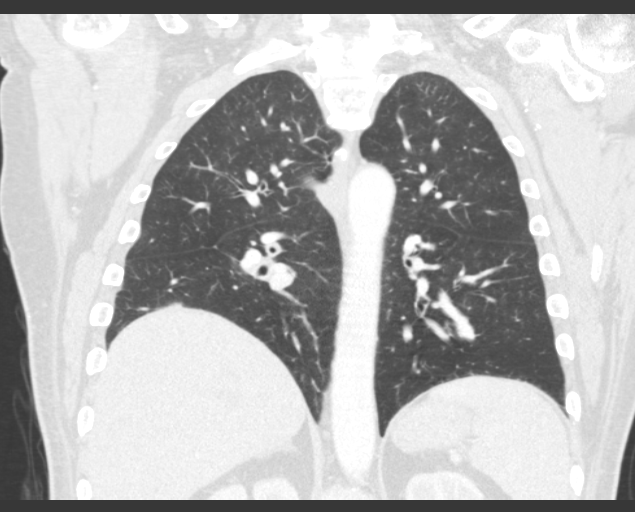

[12 of 36 positions shown; findings below may reference images not displayed]

FINDINGS: Mediastinum/Lymph Nodes: Heart size is borderline enlarged. There is
no significant pericardial fluid, thickening or pericardial
calcification. There is atherosclerosis of the thoracic aorta, the
great vessels of the mediastinum and the coronary arteries,
including calcified atherosclerotic plaque in the left anterior
descending, left circumflex and right coronary arteries. Status post
median sternotomy for CABG, including [REDACTED] to the LAD. No
pathologically enlarged mediastinal or hilar lymph nodes. Finding on
the recent chest radiograph corresponds to a fatty attenuation
lesion in the right-side of the anterior mediastinum which measures
up to 4.3 x 4.1 x 4.5 cm. This is associated with some surgical
clips and an abandoned epicardial pacing lead, and is favored to
represent an area of postoperative fat necrosis in the anterior
mediastinum. Esophagus is unremarkable in appearance. No axillary
lymphadenopathy.

Lungs/Pleura: Mild diffuse centrilobular ground-glass attenuation
micronodularity, suggestive of a smoking related disease. No larger
more suspicious appearing pulmonary nodules or masses. No acute
consolidative airspace disease. Mild diffuse bronchial thickening
with mild paraseptal emphysema.

Upper Abdomen: 7 mm partially calcified gallstone lying dependently
in the neck of the gallbladder.

Musculoskeletal/Soft Tissues: Median sternotomy wires. There are no
aggressive appearing lytic or blastic lesions noted in the
visualized portions of the skeleton.
IMPRESSION: 1. Finding on the recent chest radiograph corresponds to a
benign-appearing fatty attenuation lesion in the right-sided the
anterior mediastinum. This has a benign appearance, which is most
compatible with an area of fat necrosis likely related to prior
surgery and instrumentation.
2. Mild diffuse centrilobular ground-glass attenuation
micronodularity throughout the lungs bilaterally, suggestive of a
smoking related disease such as respiratory bronchiolitis (ANEPSY), or
if the patient is symptomatic, respiratory bronchiolitis
interstitial lung disease (INOKE).
3. Mild diffuse bronchial thickening with mild paraseptal emphysema;
imaging findings suggestive of underlying COPD.
4. Atherosclerosis, including three-vessel coronary artery disease.
Status post median sternotomy for CABG, including [REDACTED] to the LAD.

## 2017-02-18 DIAGNOSIS — R220 Localized swelling, mass and lump, head: Secondary | ICD-10-CM | POA: Diagnosis not present

## 2017-02-18 DIAGNOSIS — J3489 Other specified disorders of nose and nasal sinuses: Secondary | ICD-10-CM | POA: Diagnosis not present

## 2017-02-18 DIAGNOSIS — J340 Abscess, furuncle and carbuncle of nose: Secondary | ICD-10-CM | POA: Diagnosis not present

## 2017-02-18 DIAGNOSIS — I252 Old myocardial infarction: Secondary | ICD-10-CM | POA: Diagnosis not present

## 2017-02-18 DIAGNOSIS — E1142 Type 2 diabetes mellitus with diabetic polyneuropathy: Secondary | ICD-10-CM | POA: Insufficient documentation

## 2017-02-18 DIAGNOSIS — E1165 Type 2 diabetes mellitus with hyperglycemia: Secondary | ICD-10-CM | POA: Diagnosis not present

## 2017-02-18 DIAGNOSIS — Z720 Tobacco use: Secondary | ICD-10-CM | POA: Diagnosis not present

## 2017-02-18 DIAGNOSIS — E785 Hyperlipidemia, unspecified: Secondary | ICD-10-CM | POA: Insufficient documentation

## 2017-02-18 DIAGNOSIS — J342 Deviated nasal septum: Secondary | ICD-10-CM | POA: Diagnosis not present

## 2017-02-18 DIAGNOSIS — R51 Headache: Secondary | ICD-10-CM | POA: Diagnosis not present

## 2017-02-18 DIAGNOSIS — I10 Essential (primary) hypertension: Secondary | ICD-10-CM

## 2017-02-18 DIAGNOSIS — L03211 Cellulitis of face: Secondary | ICD-10-CM | POA: Diagnosis not present

## 2017-02-18 DIAGNOSIS — Z972 Presence of dental prosthetic device (complete) (partial): Secondary | ICD-10-CM | POA: Diagnosis not present

## 2017-02-18 DIAGNOSIS — R69 Illness, unspecified: Secondary | ICD-10-CM | POA: Diagnosis not present

## 2017-02-18 DIAGNOSIS — E871 Hypo-osmolality and hyponatremia: Secondary | ICD-10-CM | POA: Insufficient documentation

## 2017-02-18 DIAGNOSIS — I6523 Occlusion and stenosis of bilateral carotid arteries: Secondary | ICD-10-CM | POA: Diagnosis not present

## 2017-02-18 DIAGNOSIS — R5383 Other fatigue: Secondary | ICD-10-CM | POA: Diagnosis not present

## 2017-02-18 HISTORY — DX: Hypo-osmolality and hyponatremia: E87.1

## 2017-02-18 HISTORY — DX: Hyperlipidemia, unspecified: E78.5

## 2017-02-18 HISTORY — DX: Type 2 diabetes mellitus with diabetic polyneuropathy: E11.42

## 2017-02-18 HISTORY — DX: Essential (primary) hypertension: I10

## 2017-03-01 DIAGNOSIS — L0291 Cutaneous abscess, unspecified: Secondary | ICD-10-CM | POA: Diagnosis not present

## 2017-03-01 DIAGNOSIS — I10 Essential (primary) hypertension: Secondary | ICD-10-CM | POA: Diagnosis not present

## 2017-03-01 DIAGNOSIS — Z6826 Body mass index (BMI) 26.0-26.9, adult: Secondary | ICD-10-CM | POA: Diagnosis not present

## 2017-03-10 DIAGNOSIS — Z96651 Presence of right artificial knee joint: Secondary | ICD-10-CM | POA: Diagnosis not present

## 2017-03-10 DIAGNOSIS — I10 Essential (primary) hypertension: Secondary | ICD-10-CM | POA: Diagnosis not present

## 2017-03-10 DIAGNOSIS — M25461 Effusion, right knee: Secondary | ICD-10-CM | POA: Diagnosis not present

## 2017-03-10 DIAGNOSIS — M25561 Pain in right knee: Secondary | ICD-10-CM | POA: Diagnosis not present

## 2017-03-10 DIAGNOSIS — Z79899 Other long term (current) drug therapy: Secondary | ICD-10-CM | POA: Diagnosis not present

## 2017-03-10 DIAGNOSIS — Z794 Long term (current) use of insulin: Secondary | ICD-10-CM | POA: Diagnosis not present

## 2017-03-10 DIAGNOSIS — R509 Fever, unspecified: Secondary | ICD-10-CM | POA: Diagnosis not present

## 2017-03-10 DIAGNOSIS — E119 Type 2 diabetes mellitus without complications: Secondary | ICD-10-CM | POA: Diagnosis not present

## 2017-03-10 DIAGNOSIS — R69 Illness, unspecified: Secondary | ICD-10-CM | POA: Diagnosis not present

## 2017-03-11 DIAGNOSIS — Z96651 Presence of right artificial knee joint: Secondary | ICD-10-CM | POA: Diagnosis not present

## 2017-03-11 DIAGNOSIS — R69 Illness, unspecified: Secondary | ICD-10-CM | POA: Diagnosis not present

## 2017-03-11 DIAGNOSIS — Z794 Long term (current) use of insulin: Secondary | ICD-10-CM | POA: Diagnosis not present

## 2017-03-11 DIAGNOSIS — M25561 Pain in right knee: Secondary | ICD-10-CM | POA: Diagnosis not present

## 2017-03-11 DIAGNOSIS — I10 Essential (primary) hypertension: Secondary | ICD-10-CM | POA: Diagnosis not present

## 2017-03-11 DIAGNOSIS — E119 Type 2 diabetes mellitus without complications: Secondary | ICD-10-CM | POA: Diagnosis not present

## 2017-03-11 DIAGNOSIS — R509 Fever, unspecified: Secondary | ICD-10-CM | POA: Diagnosis not present

## 2017-03-11 DIAGNOSIS — M25461 Effusion, right knee: Secondary | ICD-10-CM | POA: Diagnosis not present

## 2017-03-11 DIAGNOSIS — Z79899 Other long term (current) drug therapy: Secondary | ICD-10-CM | POA: Diagnosis not present

## 2017-03-13 DIAGNOSIS — E1140 Type 2 diabetes mellitus with diabetic neuropathy, unspecified: Secondary | ICD-10-CM | POA: Diagnosis not present

## 2017-03-13 DIAGNOSIS — J069 Acute upper respiratory infection, unspecified: Secondary | ICD-10-CM | POA: Diagnosis not present

## 2017-03-13 DIAGNOSIS — M069 Rheumatoid arthritis, unspecified: Secondary | ICD-10-CM | POA: Diagnosis not present

## 2017-03-13 DIAGNOSIS — E119 Type 2 diabetes mellitus without complications: Secondary | ICD-10-CM | POA: Diagnosis not present

## 2017-03-13 DIAGNOSIS — E1165 Type 2 diabetes mellitus with hyperglycemia: Secondary | ICD-10-CM | POA: Diagnosis not present

## 2017-03-13 DIAGNOSIS — R319 Hematuria, unspecified: Secondary | ICD-10-CM | POA: Diagnosis not present

## 2017-03-14 DIAGNOSIS — I10 Essential (primary) hypertension: Secondary | ICD-10-CM | POA: Diagnosis not present

## 2017-03-14 DIAGNOSIS — R05 Cough: Secondary | ICD-10-CM | POA: Diagnosis not present

## 2017-03-14 DIAGNOSIS — Z6824 Body mass index (BMI) 24.0-24.9, adult: Secondary | ICD-10-CM | POA: Diagnosis not present

## 2017-03-14 DIAGNOSIS — E1165 Type 2 diabetes mellitus with hyperglycemia: Secondary | ICD-10-CM | POA: Diagnosis not present

## 2017-03-20 DIAGNOSIS — E1165 Type 2 diabetes mellitus with hyperglycemia: Secondary | ICD-10-CM | POA: Diagnosis not present

## 2017-03-20 DIAGNOSIS — E1140 Type 2 diabetes mellitus with diabetic neuropathy, unspecified: Secondary | ICD-10-CM | POA: Diagnosis not present

## 2017-03-20 DIAGNOSIS — T24101D Burn of first degree of unspecified site of right lower limb, except ankle and foot, subsequent encounter: Secondary | ICD-10-CM | POA: Diagnosis not present

## 2017-03-20 DIAGNOSIS — Z6824 Body mass index (BMI) 24.0-24.9, adult: Secondary | ICD-10-CM | POA: Diagnosis not present

## 2017-03-21 DIAGNOSIS — R109 Unspecified abdominal pain: Secondary | ICD-10-CM | POA: Diagnosis not present

## 2017-03-21 DIAGNOSIS — R93421 Abnormal radiologic findings on diagnostic imaging of right kidney: Secondary | ICD-10-CM | POA: Diagnosis not present

## 2017-03-21 DIAGNOSIS — R319 Hematuria, unspecified: Secondary | ICD-10-CM | POA: Diagnosis not present

## 2017-03-21 DIAGNOSIS — K8020 Calculus of gallbladder without cholecystitis without obstruction: Secondary | ICD-10-CM | POA: Diagnosis not present

## 2017-03-29 DIAGNOSIS — E1165 Type 2 diabetes mellitus with hyperglycemia: Secondary | ICD-10-CM | POA: Diagnosis not present

## 2017-03-29 DIAGNOSIS — R109 Unspecified abdominal pain: Secondary | ICD-10-CM | POA: Diagnosis not present

## 2017-03-29 DIAGNOSIS — E1140 Type 2 diabetes mellitus with diabetic neuropathy, unspecified: Secondary | ICD-10-CM | POA: Diagnosis not present

## 2017-03-29 DIAGNOSIS — N200 Calculus of kidney: Secondary | ICD-10-CM | POA: Diagnosis not present

## 2017-04-03 DIAGNOSIS — T24101D Burn of first degree of unspecified site of right lower limb, except ankle and foot, subsequent encounter: Secondary | ICD-10-CM | POA: Diagnosis not present

## 2017-04-03 DIAGNOSIS — M549 Dorsalgia, unspecified: Secondary | ICD-10-CM | POA: Diagnosis not present

## 2017-04-03 DIAGNOSIS — Z6824 Body mass index (BMI) 24.0-24.9, adult: Secondary | ICD-10-CM | POA: Diagnosis not present

## 2017-04-10 DIAGNOSIS — Z981 Arthrodesis status: Secondary | ICD-10-CM | POA: Diagnosis not present

## 2017-04-10 DIAGNOSIS — M961 Postlaminectomy syndrome, not elsewhere classified: Secondary | ICD-10-CM | POA: Diagnosis not present

## 2017-04-10 DIAGNOSIS — M62830 Muscle spasm of back: Secondary | ICD-10-CM | POA: Diagnosis not present

## 2017-04-10 DIAGNOSIS — M5412 Radiculopathy, cervical region: Secondary | ICD-10-CM | POA: Diagnosis not present

## 2017-04-12 DIAGNOSIS — J449 Chronic obstructive pulmonary disease, unspecified: Secondary | ICD-10-CM | POA: Diagnosis not present

## 2017-04-12 DIAGNOSIS — Z1389 Encounter for screening for other disorder: Secondary | ICD-10-CM | POA: Diagnosis not present

## 2017-04-12 DIAGNOSIS — E1140 Type 2 diabetes mellitus with diabetic neuropathy, unspecified: Secondary | ICD-10-CM | POA: Diagnosis not present

## 2017-04-12 DIAGNOSIS — E1165 Type 2 diabetes mellitus with hyperglycemia: Secondary | ICD-10-CM | POA: Diagnosis not present

## 2017-04-12 DIAGNOSIS — E119 Type 2 diabetes mellitus without complications: Secondary | ICD-10-CM | POA: Diagnosis not present

## 2017-04-12 DIAGNOSIS — I219 Acute myocardial infarction, unspecified: Secondary | ICD-10-CM | POA: Diagnosis not present

## 2017-04-12 DIAGNOSIS — Z139 Encounter for screening, unspecified: Secondary | ICD-10-CM | POA: Diagnosis not present

## 2017-04-12 DIAGNOSIS — R69 Illness, unspecified: Secondary | ICD-10-CM | POA: Diagnosis not present

## 2017-04-12 DIAGNOSIS — M79609 Pain in unspecified limb: Secondary | ICD-10-CM | POA: Diagnosis not present

## 2017-04-12 DIAGNOSIS — I10 Essential (primary) hypertension: Secondary | ICD-10-CM | POA: Diagnosis not present

## 2017-04-12 DIAGNOSIS — F17200 Nicotine dependence, unspecified, uncomplicated: Secondary | ICD-10-CM | POA: Diagnosis not present

## 2017-04-26 DIAGNOSIS — K921 Melena: Secondary | ICD-10-CM | POA: Diagnosis not present

## 2017-04-26 DIAGNOSIS — R109 Unspecified abdominal pain: Secondary | ICD-10-CM | POA: Diagnosis not present

## 2017-04-26 DIAGNOSIS — R918 Other nonspecific abnormal finding of lung field: Secondary | ICD-10-CM | POA: Diagnosis not present

## 2017-04-26 DIAGNOSIS — R1110 Vomiting, unspecified: Secondary | ICD-10-CM | POA: Diagnosis not present

## 2017-04-26 DIAGNOSIS — R634 Abnormal weight loss: Secondary | ICD-10-CM | POA: Diagnosis not present

## 2017-05-03 DIAGNOSIS — N200 Calculus of kidney: Secondary | ICD-10-CM | POA: Diagnosis not present

## 2017-05-03 DIAGNOSIS — Z789 Other specified health status: Secondary | ICD-10-CM | POA: Diagnosis not present

## 2017-05-03 DIAGNOSIS — I2510 Atherosclerotic heart disease of native coronary artery without angina pectoris: Secondary | ICD-10-CM | POA: Diagnosis not present

## 2017-05-03 DIAGNOSIS — W57XXXA Bitten or stung by nonvenomous insect and other nonvenomous arthropods, initial encounter: Secondary | ICD-10-CM | POA: Diagnosis not present

## 2017-05-03 DIAGNOSIS — S2096XA Insect bite (nonvenomous) of unspecified parts of thorax, initial encounter: Secondary | ICD-10-CM | POA: Diagnosis not present

## 2017-05-04 DIAGNOSIS — J302 Other seasonal allergic rhinitis: Secondary | ICD-10-CM | POA: Diagnosis not present

## 2017-05-04 DIAGNOSIS — J3081 Allergic rhinitis due to animal (cat) (dog) hair and dander: Secondary | ICD-10-CM | POA: Diagnosis not present

## 2017-05-07 DIAGNOSIS — R918 Other nonspecific abnormal finding of lung field: Secondary | ICD-10-CM | POA: Diagnosis not present

## 2017-05-07 DIAGNOSIS — J3081 Allergic rhinitis due to animal (cat) (dog) hair and dander: Secondary | ICD-10-CM | POA: Diagnosis not present

## 2017-05-07 DIAGNOSIS — J302 Other seasonal allergic rhinitis: Secondary | ICD-10-CM | POA: Diagnosis not present

## 2017-05-07 DIAGNOSIS — R634 Abnormal weight loss: Secondary | ICD-10-CM | POA: Diagnosis not present

## 2017-05-08 DIAGNOSIS — J3081 Allergic rhinitis due to animal (cat) (dog) hair and dander: Secondary | ICD-10-CM | POA: Diagnosis not present

## 2017-05-08 DIAGNOSIS — J302 Other seasonal allergic rhinitis: Secondary | ICD-10-CM | POA: Diagnosis not present

## 2017-05-15 DIAGNOSIS — M5412 Radiculopathy, cervical region: Secondary | ICD-10-CM | POA: Diagnosis not present

## 2017-05-15 DIAGNOSIS — M961 Postlaminectomy syndrome, not elsewhere classified: Secondary | ICD-10-CM | POA: Diagnosis not present

## 2017-05-15 DIAGNOSIS — E1165 Type 2 diabetes mellitus with hyperglycemia: Secondary | ICD-10-CM | POA: Diagnosis not present

## 2017-05-15 DIAGNOSIS — E1140 Type 2 diabetes mellitus with diabetic neuropathy, unspecified: Secondary | ICD-10-CM | POA: Diagnosis not present

## 2017-05-15 DIAGNOSIS — Z981 Arthrodesis status: Secondary | ICD-10-CM | POA: Diagnosis not present

## 2017-05-15 DIAGNOSIS — Z6825 Body mass index (BMI) 25.0-25.9, adult: Secondary | ICD-10-CM | POA: Diagnosis not present

## 2017-05-15 DIAGNOSIS — M62838 Other muscle spasm: Secondary | ICD-10-CM | POA: Diagnosis not present

## 2017-05-15 DIAGNOSIS — M542 Cervicalgia: Secondary | ICD-10-CM | POA: Diagnosis not present

## 2017-05-22 DIAGNOSIS — R918 Other nonspecific abnormal finding of lung field: Secondary | ICD-10-CM | POA: Diagnosis not present

## 2017-05-22 DIAGNOSIS — E1165 Type 2 diabetes mellitus with hyperglycemia: Secondary | ICD-10-CM | POA: Diagnosis not present

## 2017-05-22 DIAGNOSIS — M4327 Fusion of spine, lumbosacral region: Secondary | ICD-10-CM | POA: Diagnosis not present

## 2017-05-22 DIAGNOSIS — R69 Illness, unspecified: Secondary | ICD-10-CM | POA: Diagnosis not present

## 2017-05-22 DIAGNOSIS — E1140 Type 2 diabetes mellitus with diabetic neuropathy, unspecified: Secondary | ICD-10-CM | POA: Diagnosis not present

## 2017-05-29 DIAGNOSIS — R69 Illness, unspecified: Secondary | ICD-10-CM | POA: Diagnosis not present

## 2017-05-30 DIAGNOSIS — I2510 Atherosclerotic heart disease of native coronary artery without angina pectoris: Secondary | ICD-10-CM | POA: Diagnosis not present

## 2017-05-30 DIAGNOSIS — I10 Essential (primary) hypertension: Secondary | ICD-10-CM | POA: Diagnosis not present

## 2017-05-30 DIAGNOSIS — Z951 Presence of aortocoronary bypass graft: Secondary | ICD-10-CM | POA: Diagnosis not present

## 2017-05-30 DIAGNOSIS — I208 Other forms of angina pectoris: Secondary | ICD-10-CM | POA: Diagnosis not present

## 2017-06-06 DIAGNOSIS — Z6825 Body mass index (BMI) 25.0-25.9, adult: Secondary | ICD-10-CM | POA: Diagnosis not present

## 2017-06-06 DIAGNOSIS — S99929A Unspecified injury of unspecified foot, initial encounter: Secondary | ICD-10-CM | POA: Diagnosis not present

## 2017-06-12 DIAGNOSIS — M62830 Muscle spasm of back: Secondary | ICD-10-CM | POA: Diagnosis not present

## 2017-06-12 DIAGNOSIS — M961 Postlaminectomy syndrome, not elsewhere classified: Secondary | ICD-10-CM | POA: Diagnosis not present

## 2017-06-12 DIAGNOSIS — Z981 Arthrodesis status: Secondary | ICD-10-CM | POA: Diagnosis not present

## 2017-06-12 DIAGNOSIS — M5412 Radiculopathy, cervical region: Secondary | ICD-10-CM | POA: Diagnosis not present

## 2017-06-22 DIAGNOSIS — R69 Illness, unspecified: Secondary | ICD-10-CM | POA: Diagnosis not present

## 2017-06-28 DIAGNOSIS — M792 Neuralgia and neuritis, unspecified: Secondary | ICD-10-CM | POA: Diagnosis not present

## 2017-06-28 DIAGNOSIS — Z7189 Other specified counseling: Secondary | ICD-10-CM | POA: Diagnosis not present

## 2017-06-28 DIAGNOSIS — Z6826 Body mass index (BMI) 26.0-26.9, adult: Secondary | ICD-10-CM | POA: Diagnosis not present

## 2017-07-16 DIAGNOSIS — J449 Chronic obstructive pulmonary disease, unspecified: Secondary | ICD-10-CM | POA: Diagnosis not present

## 2017-07-16 DIAGNOSIS — E1165 Type 2 diabetes mellitus with hyperglycemia: Secondary | ICD-10-CM | POA: Diagnosis not present

## 2017-07-16 DIAGNOSIS — E1140 Type 2 diabetes mellitus with diabetic neuropathy, unspecified: Secondary | ICD-10-CM | POA: Diagnosis not present

## 2017-07-16 DIAGNOSIS — Z789 Other specified health status: Secondary | ICD-10-CM | POA: Diagnosis not present

## 2017-07-20 DIAGNOSIS — J3089 Other allergic rhinitis: Secondary | ICD-10-CM | POA: Diagnosis not present

## 2017-08-02 DIAGNOSIS — E1140 Type 2 diabetes mellitus with diabetic neuropathy, unspecified: Secondary | ICD-10-CM | POA: Diagnosis not present

## 2017-08-02 DIAGNOSIS — M069 Rheumatoid arthritis, unspecified: Secondary | ICD-10-CM | POA: Diagnosis not present

## 2017-08-02 DIAGNOSIS — E1165 Type 2 diabetes mellitus with hyperglycemia: Secondary | ICD-10-CM | POA: Diagnosis not present

## 2017-08-02 DIAGNOSIS — Z7189 Other specified counseling: Secondary | ICD-10-CM | POA: Diagnosis not present

## 2017-08-16 DIAGNOSIS — E1140 Type 2 diabetes mellitus with diabetic neuropathy, unspecified: Secondary | ICD-10-CM | POA: Diagnosis not present

## 2017-08-16 DIAGNOSIS — Z6827 Body mass index (BMI) 27.0-27.9, adult: Secondary | ICD-10-CM | POA: Diagnosis not present

## 2017-08-16 DIAGNOSIS — E1165 Type 2 diabetes mellitus with hyperglycemia: Secondary | ICD-10-CM | POA: Diagnosis not present

## 2017-08-16 DIAGNOSIS — J3089 Other allergic rhinitis: Secondary | ICD-10-CM | POA: Diagnosis not present

## 2017-08-23 DIAGNOSIS — J3089 Other allergic rhinitis: Secondary | ICD-10-CM | POA: Diagnosis not present

## 2017-08-23 DIAGNOSIS — Z13220 Encounter for screening for lipoid disorders: Secondary | ICD-10-CM | POA: Diagnosis not present

## 2017-08-23 DIAGNOSIS — E1140 Type 2 diabetes mellitus with diabetic neuropathy, unspecified: Secondary | ICD-10-CM | POA: Diagnosis not present

## 2017-08-23 DIAGNOSIS — I10 Essential (primary) hypertension: Secondary | ICD-10-CM | POA: Diagnosis not present

## 2017-08-23 DIAGNOSIS — Z125 Encounter for screening for malignant neoplasm of prostate: Secondary | ICD-10-CM | POA: Diagnosis not present

## 2017-08-30 DIAGNOSIS — E1159 Type 2 diabetes mellitus with other circulatory complications: Secondary | ICD-10-CM | POA: Diagnosis not present

## 2017-08-30 DIAGNOSIS — Z0000 Encounter for general adult medical examination without abnormal findings: Secondary | ICD-10-CM | POA: Diagnosis not present

## 2017-08-30 DIAGNOSIS — E1140 Type 2 diabetes mellitus with diabetic neuropathy, unspecified: Secondary | ICD-10-CM | POA: Diagnosis not present

## 2017-08-30 DIAGNOSIS — Z1331 Encounter for screening for depression: Secondary | ICD-10-CM | POA: Diagnosis not present

## 2017-08-30 DIAGNOSIS — J3089 Other allergic rhinitis: Secondary | ICD-10-CM | POA: Diagnosis not present

## 2017-08-30 DIAGNOSIS — E1165 Type 2 diabetes mellitus with hyperglycemia: Secondary | ICD-10-CM | POA: Diagnosis not present

## 2017-08-30 DIAGNOSIS — I10 Essential (primary) hypertension: Secondary | ICD-10-CM | POA: Diagnosis not present

## 2017-08-30 DIAGNOSIS — I491 Atrial premature depolarization: Secondary | ICD-10-CM | POA: Diagnosis not present

## 2017-09-06 DIAGNOSIS — J3089 Other allergic rhinitis: Secondary | ICD-10-CM | POA: Diagnosis not present

## 2017-09-13 DIAGNOSIS — J3089 Other allergic rhinitis: Secondary | ICD-10-CM | POA: Diagnosis not present

## 2017-09-13 DIAGNOSIS — E1165 Type 2 diabetes mellitus with hyperglycemia: Secondary | ICD-10-CM | POA: Diagnosis not present

## 2017-09-13 DIAGNOSIS — E1140 Type 2 diabetes mellitus with diabetic neuropathy, unspecified: Secondary | ICD-10-CM | POA: Diagnosis not present

## 2017-09-27 DIAGNOSIS — J3089 Other allergic rhinitis: Secondary | ICD-10-CM | POA: Diagnosis not present

## 2017-10-01 DIAGNOSIS — J3089 Other allergic rhinitis: Secondary | ICD-10-CM | POA: Diagnosis not present

## 2017-10-09 DIAGNOSIS — E1165 Type 2 diabetes mellitus with hyperglycemia: Secondary | ICD-10-CM | POA: Diagnosis not present

## 2017-10-09 DIAGNOSIS — I491 Atrial premature depolarization: Secondary | ICD-10-CM | POA: Diagnosis not present

## 2017-10-09 DIAGNOSIS — E1159 Type 2 diabetes mellitus with other circulatory complications: Secondary | ICD-10-CM | POA: Diagnosis not present

## 2017-10-09 DIAGNOSIS — J3089 Other allergic rhinitis: Secondary | ICD-10-CM | POA: Diagnosis not present

## 2017-10-10 DIAGNOSIS — S199XXA Unspecified injury of neck, initial encounter: Secondary | ICD-10-CM | POA: Diagnosis not present

## 2017-10-10 DIAGNOSIS — S0990XA Unspecified injury of head, initial encounter: Secondary | ICD-10-CM | POA: Diagnosis not present

## 2017-10-10 DIAGNOSIS — G8911 Acute pain due to trauma: Secondary | ICD-10-CM | POA: Diagnosis not present

## 2017-10-10 DIAGNOSIS — R51 Headache: Secondary | ICD-10-CM | POA: Diagnosis not present

## 2017-10-10 DIAGNOSIS — R739 Hyperglycemia, unspecified: Secondary | ICD-10-CM | POA: Diagnosis not present

## 2017-10-15 DIAGNOSIS — E1140 Type 2 diabetes mellitus with diabetic neuropathy, unspecified: Secondary | ICD-10-CM | POA: Diagnosis not present

## 2017-10-15 DIAGNOSIS — J3089 Other allergic rhinitis: Secondary | ICD-10-CM | POA: Diagnosis not present

## 2017-10-15 DIAGNOSIS — E1165 Type 2 diabetes mellitus with hyperglycemia: Secondary | ICD-10-CM | POA: Diagnosis not present

## 2017-10-15 DIAGNOSIS — S161XXD Strain of muscle, fascia and tendon at neck level, subsequent encounter: Secondary | ICD-10-CM | POA: Diagnosis not present

## 2017-10-15 DIAGNOSIS — Z9189 Other specified personal risk factors, not elsewhere classified: Secondary | ICD-10-CM | POA: Diagnosis not present

## 2017-10-24 DIAGNOSIS — M4327 Fusion of spine, lumbosacral region: Secondary | ICD-10-CM | POA: Diagnosis not present

## 2017-10-24 DIAGNOSIS — E1140 Type 2 diabetes mellitus with diabetic neuropathy, unspecified: Secondary | ICD-10-CM | POA: Diagnosis not present

## 2017-10-24 DIAGNOSIS — J3089 Other allergic rhinitis: Secondary | ICD-10-CM | POA: Diagnosis not present

## 2017-10-24 DIAGNOSIS — E1165 Type 2 diabetes mellitus with hyperglycemia: Secondary | ICD-10-CM | POA: Diagnosis not present

## 2017-10-24 DIAGNOSIS — R69 Illness, unspecified: Secondary | ICD-10-CM | POA: Diagnosis not present

## 2017-11-06 DIAGNOSIS — E1165 Type 2 diabetes mellitus with hyperglycemia: Secondary | ICD-10-CM | POA: Diagnosis not present

## 2017-11-06 DIAGNOSIS — E1140 Type 2 diabetes mellitus with diabetic neuropathy, unspecified: Secondary | ICD-10-CM | POA: Diagnosis not present

## 2017-11-06 DIAGNOSIS — J3089 Other allergic rhinitis: Secondary | ICD-10-CM | POA: Diagnosis not present

## 2017-11-12 DIAGNOSIS — J3089 Other allergic rhinitis: Secondary | ICD-10-CM | POA: Diagnosis not present

## 2017-11-21 DIAGNOSIS — J3089 Other allergic rhinitis: Secondary | ICD-10-CM | POA: Diagnosis not present

## 2017-12-03 DIAGNOSIS — J3089 Other allergic rhinitis: Secondary | ICD-10-CM | POA: Diagnosis not present

## 2017-12-03 DIAGNOSIS — Z6826 Body mass index (BMI) 26.0-26.9, adult: Secondary | ICD-10-CM | POA: Diagnosis not present

## 2017-12-03 DIAGNOSIS — K5902 Outlet dysfunction constipation: Secondary | ICD-10-CM | POA: Diagnosis not present

## 2017-12-07 DIAGNOSIS — R1012 Left upper quadrant pain: Secondary | ICD-10-CM | POA: Diagnosis not present

## 2017-12-07 DIAGNOSIS — K5902 Outlet dysfunction constipation: Secondary | ICD-10-CM | POA: Diagnosis not present

## 2017-12-10 DIAGNOSIS — J3089 Other allergic rhinitis: Secondary | ICD-10-CM | POA: Diagnosis not present

## 2017-12-10 DIAGNOSIS — K8020 Calculus of gallbladder without cholecystitis without obstruction: Secondary | ICD-10-CM | POA: Diagnosis not present

## 2017-12-10 DIAGNOSIS — K5909 Other constipation: Secondary | ICD-10-CM | POA: Diagnosis not present

## 2017-12-10 DIAGNOSIS — Z6826 Body mass index (BMI) 26.0-26.9, adult: Secondary | ICD-10-CM | POA: Diagnosis not present

## 2017-12-10 DIAGNOSIS — R198 Other specified symptoms and signs involving the digestive system and abdomen: Secondary | ICD-10-CM | POA: Diagnosis not present

## 2017-12-15 DIAGNOSIS — K829 Disease of gallbladder, unspecified: Secondary | ICD-10-CM | POA: Diagnosis not present

## 2017-12-15 DIAGNOSIS — R1011 Right upper quadrant pain: Secondary | ICD-10-CM | POA: Diagnosis not present

## 2017-12-15 DIAGNOSIS — R198 Other specified symptoms and signs involving the digestive system and abdomen: Secondary | ICD-10-CM | POA: Diagnosis not present

## 2017-12-17 DIAGNOSIS — J3089 Other allergic rhinitis: Secondary | ICD-10-CM | POA: Diagnosis not present

## 2017-12-26 DIAGNOSIS — I249 Acute ischemic heart disease, unspecified: Secondary | ICD-10-CM | POA: Diagnosis not present

## 2017-12-26 DIAGNOSIS — R69 Illness, unspecified: Secondary | ICD-10-CM | POA: Diagnosis not present

## 2017-12-26 DIAGNOSIS — A419 Sepsis, unspecified organism: Secondary | ICD-10-CM | POA: Diagnosis not present

## 2017-12-26 DIAGNOSIS — R0602 Shortness of breath: Secondary | ICD-10-CM | POA: Diagnosis not present

## 2017-12-26 DIAGNOSIS — J9601 Acute respiratory failure with hypoxia: Secondary | ICD-10-CM | POA: Diagnosis not present

## 2017-12-26 DIAGNOSIS — R509 Fever, unspecified: Secondary | ICD-10-CM | POA: Diagnosis not present

## 2017-12-26 DIAGNOSIS — B9789 Other viral agents as the cause of diseases classified elsewhere: Secondary | ICD-10-CM | POA: Diagnosis not present

## 2017-12-26 DIAGNOSIS — F17210 Nicotine dependence, cigarettes, uncomplicated: Secondary | ICD-10-CM | POA: Diagnosis not present

## 2017-12-26 DIAGNOSIS — I301 Infective pericarditis: Secondary | ICD-10-CM | POA: Diagnosis not present

## 2017-12-26 DIAGNOSIS — I2510 Atherosclerotic heart disease of native coronary artery without angina pectoris: Secondary | ICD-10-CM | POA: Diagnosis not present

## 2017-12-26 DIAGNOSIS — I252 Old myocardial infarction: Secondary | ICD-10-CM | POA: Diagnosis not present

## 2017-12-26 DIAGNOSIS — J101 Influenza due to other identified influenza virus with other respiratory manifestations: Secondary | ICD-10-CM | POA: Diagnosis not present

## 2017-12-26 DIAGNOSIS — J449 Chronic obstructive pulmonary disease, unspecified: Secondary | ICD-10-CM | POA: Diagnosis not present

## 2017-12-26 DIAGNOSIS — J09X2 Influenza due to identified novel influenza A virus with other respiratory manifestations: Secondary | ICD-10-CM | POA: Diagnosis not present

## 2017-12-26 DIAGNOSIS — R0902 Hypoxemia: Secondary | ICD-10-CM | POA: Diagnosis not present

## 2017-12-26 DIAGNOSIS — Z951 Presence of aortocoronary bypass graft: Secondary | ICD-10-CM | POA: Diagnosis not present

## 2017-12-26 DIAGNOSIS — R778 Other specified abnormalities of plasma proteins: Secondary | ICD-10-CM | POA: Diagnosis not present

## 2017-12-26 DIAGNOSIS — R7989 Other specified abnormal findings of blood chemistry: Secondary | ICD-10-CM | POA: Diagnosis not present

## 2017-12-27 ENCOUNTER — Encounter: Payer: Self-pay | Admitting: Gastroenterology

## 2017-12-27 DIAGNOSIS — R7989 Other specified abnormal findings of blood chemistry: Secondary | ICD-10-CM | POA: Diagnosis not present

## 2017-12-27 DIAGNOSIS — R0602 Shortness of breath: Secondary | ICD-10-CM | POA: Diagnosis not present

## 2018-01-02 DIAGNOSIS — J449 Chronic obstructive pulmonary disease, unspecified: Secondary | ICD-10-CM | POA: Diagnosis not present

## 2018-01-02 DIAGNOSIS — Z6825 Body mass index (BMI) 25.0-25.9, adult: Secondary | ICD-10-CM | POA: Diagnosis not present

## 2018-01-02 DIAGNOSIS — R918 Other nonspecific abnormal finding of lung field: Secondary | ICD-10-CM | POA: Diagnosis not present

## 2018-01-02 DIAGNOSIS — R05 Cough: Secondary | ICD-10-CM | POA: Diagnosis not present

## 2018-01-02 DIAGNOSIS — Z9189 Other specified personal risk factors, not elsewhere classified: Secondary | ICD-10-CM | POA: Diagnosis not present

## 2018-01-04 DIAGNOSIS — J189 Pneumonia, unspecified organism: Secondary | ICD-10-CM | POA: Diagnosis not present

## 2018-01-04 DIAGNOSIS — R0600 Dyspnea, unspecified: Secondary | ICD-10-CM | POA: Diagnosis not present

## 2018-01-04 DIAGNOSIS — R0902 Hypoxemia: Secondary | ICD-10-CM | POA: Diagnosis not present

## 2018-01-05 DIAGNOSIS — R0902 Hypoxemia: Secondary | ICD-10-CM | POA: Diagnosis not present

## 2018-01-05 DIAGNOSIS — E785 Hyperlipidemia, unspecified: Secondary | ICD-10-CM | POA: Diagnosis not present

## 2018-01-05 DIAGNOSIS — J441 Chronic obstructive pulmonary disease with (acute) exacerbation: Secondary | ICD-10-CM | POA: Diagnosis not present

## 2018-01-05 DIAGNOSIS — I2510 Atherosclerotic heart disease of native coronary artery without angina pectoris: Secondary | ICD-10-CM | POA: Diagnosis not present

## 2018-01-05 DIAGNOSIS — J189 Pneumonia, unspecified organism: Secondary | ICD-10-CM | POA: Diagnosis not present

## 2018-01-05 DIAGNOSIS — R69 Illness, unspecified: Secondary | ICD-10-CM | POA: Diagnosis not present

## 2018-01-05 DIAGNOSIS — Z951 Presence of aortocoronary bypass graft: Secondary | ICD-10-CM | POA: Diagnosis not present

## 2018-01-05 DIAGNOSIS — I5033 Acute on chronic diastolic (congestive) heart failure: Secondary | ICD-10-CM | POA: Diagnosis not present

## 2018-01-05 DIAGNOSIS — J9601 Acute respiratory failure with hypoxia: Secondary | ICD-10-CM | POA: Diagnosis not present

## 2018-01-05 DIAGNOSIS — J440 Chronic obstructive pulmonary disease with acute lower respiratory infection: Secondary | ICD-10-CM | POA: Diagnosis not present

## 2018-01-05 DIAGNOSIS — R0602 Shortness of breath: Secondary | ICD-10-CM | POA: Diagnosis not present

## 2018-01-05 DIAGNOSIS — E119 Type 2 diabetes mellitus without complications: Secondary | ICD-10-CM | POA: Diagnosis not present

## 2018-01-05 DIAGNOSIS — I110 Hypertensive heart disease with heart failure: Secondary | ICD-10-CM | POA: Diagnosis not present

## 2018-01-05 DIAGNOSIS — R0600 Dyspnea, unspecified: Secondary | ICD-10-CM | POA: Diagnosis not present

## 2018-01-09 DIAGNOSIS — Z9189 Other specified personal risk factors, not elsewhere classified: Secondary | ICD-10-CM | POA: Diagnosis not present

## 2018-01-09 DIAGNOSIS — J3089 Other allergic rhinitis: Secondary | ICD-10-CM | POA: Diagnosis not present

## 2018-01-09 DIAGNOSIS — J189 Pneumonia, unspecified organism: Secondary | ICD-10-CM | POA: Diagnosis not present

## 2018-01-09 DIAGNOSIS — I509 Heart failure, unspecified: Secondary | ICD-10-CM | POA: Diagnosis not present

## 2018-01-15 DIAGNOSIS — R918 Other nonspecific abnormal finding of lung field: Secondary | ICD-10-CM | POA: Diagnosis not present

## 2018-01-15 DIAGNOSIS — J189 Pneumonia, unspecified organism: Secondary | ICD-10-CM | POA: Diagnosis not present

## 2018-01-16 DIAGNOSIS — R55 Syncope and collapse: Secondary | ICD-10-CM | POA: Diagnosis not present

## 2018-01-16 DIAGNOSIS — I509 Heart failure, unspecified: Secondary | ICD-10-CM | POA: Diagnosis not present

## 2018-01-16 DIAGNOSIS — I951 Orthostatic hypotension: Secondary | ICD-10-CM | POA: Diagnosis not present

## 2018-01-16 DIAGNOSIS — Z6825 Body mass index (BMI) 25.0-25.9, adult: Secondary | ICD-10-CM | POA: Diagnosis not present

## 2018-01-21 DIAGNOSIS — J3089 Other allergic rhinitis: Secondary | ICD-10-CM | POA: Diagnosis not present

## 2018-01-22 DIAGNOSIS — J9601 Acute respiratory failure with hypoxia: Secondary | ICD-10-CM | POA: Diagnosis not present

## 2018-01-23 DIAGNOSIS — R55 Syncope and collapse: Secondary | ICD-10-CM | POA: Diagnosis not present

## 2018-01-23 DIAGNOSIS — I509 Heart failure, unspecified: Secondary | ICD-10-CM | POA: Diagnosis not present

## 2018-01-23 DIAGNOSIS — Z6826 Body mass index (BMI) 26.0-26.9, adult: Secondary | ICD-10-CM | POA: Diagnosis not present

## 2018-01-23 DIAGNOSIS — E860 Dehydration: Secondary | ICD-10-CM | POA: Diagnosis not present

## 2018-01-29 ENCOUNTER — Telehealth: Payer: Self-pay

## 2018-01-29 NOTE — Telephone Encounter (Signed)
SENT REFERRAL TO SCHEDULING 

## 2018-02-04 DIAGNOSIS — J3089 Other allergic rhinitis: Secondary | ICD-10-CM | POA: Diagnosis not present

## 2018-02-06 ENCOUNTER — Ambulatory Visit: Payer: Medicare Other | Admitting: Gastroenterology

## 2018-02-22 DIAGNOSIS — I951 Orthostatic hypotension: Secondary | ICD-10-CM | POA: Diagnosis not present

## 2018-02-22 DIAGNOSIS — J9601 Acute respiratory failure with hypoxia: Secondary | ICD-10-CM | POA: Diagnosis not present

## 2018-02-22 DIAGNOSIS — I509 Heart failure, unspecified: Secondary | ICD-10-CM | POA: Diagnosis not present

## 2018-02-22 DIAGNOSIS — Z6825 Body mass index (BMI) 25.0-25.9, adult: Secondary | ICD-10-CM | POA: Diagnosis not present

## 2018-02-25 DIAGNOSIS — I509 Heart failure, unspecified: Secondary | ICD-10-CM | POA: Diagnosis not present

## 2018-02-25 DIAGNOSIS — J3089 Other allergic rhinitis: Secondary | ICD-10-CM | POA: Diagnosis not present

## 2018-03-04 DIAGNOSIS — M069 Rheumatoid arthritis, unspecified: Secondary | ICD-10-CM | POA: Diagnosis not present

## 2018-03-04 DIAGNOSIS — J3089 Other allergic rhinitis: Secondary | ICD-10-CM | POA: Diagnosis not present

## 2018-03-04 DIAGNOSIS — Z6825 Body mass index (BMI) 25.0-25.9, adult: Secondary | ICD-10-CM | POA: Diagnosis not present

## 2018-03-04 DIAGNOSIS — M792 Neuralgia and neuritis, unspecified: Secondary | ICD-10-CM | POA: Diagnosis not present

## 2018-03-11 DIAGNOSIS — J3089 Other allergic rhinitis: Secondary | ICD-10-CM | POA: Diagnosis not present

## 2018-03-24 DIAGNOSIS — J9601 Acute respiratory failure with hypoxia: Secondary | ICD-10-CM | POA: Diagnosis not present

## 2018-03-27 DIAGNOSIS — G894 Chronic pain syndrome: Secondary | ICD-10-CM | POA: Diagnosis not present

## 2018-03-27 DIAGNOSIS — M792 Neuralgia and neuritis, unspecified: Secondary | ICD-10-CM | POA: Diagnosis not present

## 2018-03-27 DIAGNOSIS — Z720 Tobacco use: Secondary | ICD-10-CM | POA: Diagnosis not present

## 2018-03-27 DIAGNOSIS — M21371 Foot drop, right foot: Secondary | ICD-10-CM | POA: Diagnosis not present

## 2018-03-27 DIAGNOSIS — M069 Rheumatoid arthritis, unspecified: Secondary | ICD-10-CM | POA: Diagnosis not present

## 2018-03-27 DIAGNOSIS — Z716 Tobacco abuse counseling: Secondary | ICD-10-CM | POA: Diagnosis not present

## 2018-03-27 DIAGNOSIS — E119 Type 2 diabetes mellitus without complications: Secondary | ICD-10-CM | POA: Diagnosis not present

## 2018-03-27 DIAGNOSIS — J449 Chronic obstructive pulmonary disease, unspecified: Secondary | ICD-10-CM | POA: Diagnosis not present

## 2018-03-27 DIAGNOSIS — M545 Low back pain: Secondary | ICD-10-CM | POA: Diagnosis not present

## 2018-03-27 DIAGNOSIS — I509 Heart failure, unspecified: Secondary | ICD-10-CM | POA: Diagnosis not present

## 2018-04-24 DIAGNOSIS — E119 Type 2 diabetes mellitus without complications: Secondary | ICD-10-CM | POA: Diagnosis not present

## 2018-04-24 DIAGNOSIS — Z720 Tobacco use: Secondary | ICD-10-CM | POA: Diagnosis not present

## 2018-04-24 DIAGNOSIS — J449 Chronic obstructive pulmonary disease, unspecified: Secondary | ICD-10-CM | POA: Diagnosis not present

## 2018-04-24 DIAGNOSIS — M545 Low back pain: Secondary | ICD-10-CM | POA: Diagnosis not present

## 2018-04-24 DIAGNOSIS — M792 Neuralgia and neuritis, unspecified: Secondary | ICD-10-CM | POA: Diagnosis not present

## 2018-04-24 DIAGNOSIS — Z716 Tobacco abuse counseling: Secondary | ICD-10-CM | POA: Diagnosis not present

## 2018-04-24 DIAGNOSIS — I509 Heart failure, unspecified: Secondary | ICD-10-CM | POA: Diagnosis not present

## 2018-04-24 DIAGNOSIS — M069 Rheumatoid arthritis, unspecified: Secondary | ICD-10-CM | POA: Diagnosis not present

## 2018-04-24 DIAGNOSIS — J9601 Acute respiratory failure with hypoxia: Secondary | ICD-10-CM | POA: Diagnosis not present

## 2018-04-24 DIAGNOSIS — G894 Chronic pain syndrome: Secondary | ICD-10-CM | POA: Diagnosis not present

## 2018-04-24 DIAGNOSIS — M21371 Foot drop, right foot: Secondary | ICD-10-CM | POA: Diagnosis not present

## 2018-04-30 DIAGNOSIS — K828 Other specified diseases of gallbladder: Secondary | ICD-10-CM | POA: Diagnosis not present

## 2018-04-30 DIAGNOSIS — Z6823 Body mass index (BMI) 23.0-23.9, adult: Secondary | ICD-10-CM | POA: Diagnosis not present

## 2018-05-07 ENCOUNTER — Encounter: Payer: Self-pay | Admitting: Gastroenterology

## 2018-05-07 DIAGNOSIS — E1159 Type 2 diabetes mellitus with other circulatory complications: Secondary | ICD-10-CM | POA: Diagnosis not present

## 2018-05-07 DIAGNOSIS — E1140 Type 2 diabetes mellitus with diabetic neuropathy, unspecified: Secondary | ICD-10-CM | POA: Diagnosis not present

## 2018-05-14 DIAGNOSIS — I10 Essential (primary) hypertension: Secondary | ICD-10-CM | POA: Diagnosis not present

## 2018-05-14 DIAGNOSIS — E1159 Type 2 diabetes mellitus with other circulatory complications: Secondary | ICD-10-CM | POA: Diagnosis not present

## 2018-05-14 DIAGNOSIS — M12811 Other specific arthropathies, not elsewhere classified, right shoulder: Secondary | ICD-10-CM | POA: Diagnosis not present

## 2018-05-14 DIAGNOSIS — Z981 Arthrodesis status: Secondary | ICD-10-CM | POA: Diagnosis not present

## 2018-05-24 ENCOUNTER — Ambulatory Visit: Payer: Medicare HMO | Admitting: Gastroenterology

## 2018-05-24 DIAGNOSIS — J9601 Acute respiratory failure with hypoxia: Secondary | ICD-10-CM | POA: Diagnosis not present

## 2018-05-28 DIAGNOSIS — M7989 Other specified soft tissue disorders: Secondary | ICD-10-CM | POA: Diagnosis not present

## 2018-05-28 DIAGNOSIS — Z6825 Body mass index (BMI) 25.0-25.9, adult: Secondary | ICD-10-CM | POA: Diagnosis not present

## 2018-05-28 DIAGNOSIS — E663 Overweight: Secondary | ICD-10-CM | POA: Diagnosis not present

## 2018-05-28 DIAGNOSIS — M150 Primary generalized (osteo)arthritis: Secondary | ICD-10-CM | POA: Diagnosis not present

## 2018-05-28 DIAGNOSIS — M5030 Other cervical disc degeneration, unspecified cervical region: Secondary | ICD-10-CM | POA: Diagnosis not present

## 2018-05-28 DIAGNOSIS — M5136 Other intervertebral disc degeneration, lumbar region: Secondary | ICD-10-CM | POA: Diagnosis not present

## 2018-05-28 DIAGNOSIS — M2550 Pain in unspecified joint: Secondary | ICD-10-CM | POA: Diagnosis not present

## 2018-05-28 DIAGNOSIS — R5383 Other fatigue: Secondary | ICD-10-CM | POA: Diagnosis not present

## 2018-05-29 DIAGNOSIS — M069 Rheumatoid arthritis, unspecified: Secondary | ICD-10-CM | POA: Diagnosis not present

## 2018-05-29 DIAGNOSIS — E1159 Type 2 diabetes mellitus with other circulatory complications: Secondary | ICD-10-CM | POA: Diagnosis not present

## 2018-05-29 DIAGNOSIS — E1165 Type 2 diabetes mellitus with hyperglycemia: Secondary | ICD-10-CM | POA: Diagnosis not present

## 2018-05-29 DIAGNOSIS — E1140 Type 2 diabetes mellitus with diabetic neuropathy, unspecified: Secondary | ICD-10-CM | POA: Diagnosis not present

## 2018-05-30 DIAGNOSIS — E1165 Type 2 diabetes mellitus with hyperglycemia: Secondary | ICD-10-CM | POA: Diagnosis not present

## 2018-05-30 DIAGNOSIS — M069 Rheumatoid arthritis, unspecified: Secondary | ICD-10-CM | POA: Diagnosis not present

## 2018-05-30 DIAGNOSIS — E1140 Type 2 diabetes mellitus with diabetic neuropathy, unspecified: Secondary | ICD-10-CM | POA: Diagnosis not present

## 2018-05-30 DIAGNOSIS — R69 Illness, unspecified: Secondary | ICD-10-CM | POA: Diagnosis not present

## 2018-06-03 DIAGNOSIS — J449 Chronic obstructive pulmonary disease, unspecified: Secondary | ICD-10-CM | POA: Diagnosis not present

## 2018-06-03 DIAGNOSIS — E119 Type 2 diabetes mellitus without complications: Secondary | ICD-10-CM | POA: Diagnosis not present

## 2018-06-03 DIAGNOSIS — I509 Heart failure, unspecified: Secondary | ICD-10-CM | POA: Diagnosis not present

## 2018-06-03 DIAGNOSIS — Z720 Tobacco use: Secondary | ICD-10-CM | POA: Diagnosis not present

## 2018-06-03 DIAGNOSIS — M792 Neuralgia and neuritis, unspecified: Secondary | ICD-10-CM | POA: Diagnosis not present

## 2018-06-03 DIAGNOSIS — Z716 Tobacco abuse counseling: Secondary | ICD-10-CM | POA: Diagnosis not present

## 2018-06-03 DIAGNOSIS — G894 Chronic pain syndrome: Secondary | ICD-10-CM | POA: Diagnosis not present

## 2018-06-03 DIAGNOSIS — M069 Rheumatoid arthritis, unspecified: Secondary | ICD-10-CM | POA: Diagnosis not present

## 2018-06-03 DIAGNOSIS — G2581 Restless legs syndrome: Secondary | ICD-10-CM | POA: Diagnosis not present

## 2018-06-03 DIAGNOSIS — M545 Low back pain: Secondary | ICD-10-CM | POA: Diagnosis not present

## 2018-06-04 DIAGNOSIS — M542 Cervicalgia: Secondary | ICD-10-CM | POA: Diagnosis not present

## 2018-06-04 DIAGNOSIS — M2560 Stiffness of unspecified joint, not elsewhere classified: Secondary | ICD-10-CM | POA: Diagnosis not present

## 2018-06-04 DIAGNOSIS — M5412 Radiculopathy, cervical region: Secondary | ICD-10-CM | POA: Diagnosis not present

## 2018-06-04 DIAGNOSIS — R531 Weakness: Secondary | ICD-10-CM | POA: Diagnosis not present

## 2018-06-07 DIAGNOSIS — R531 Weakness: Secondary | ICD-10-CM | POA: Diagnosis not present

## 2018-06-07 DIAGNOSIS — M2560 Stiffness of unspecified joint, not elsewhere classified: Secondary | ICD-10-CM | POA: Diagnosis not present

## 2018-06-07 DIAGNOSIS — M542 Cervicalgia: Secondary | ICD-10-CM | POA: Diagnosis not present

## 2018-06-07 DIAGNOSIS — M5412 Radiculopathy, cervical region: Secondary | ICD-10-CM | POA: Diagnosis not present

## 2018-06-10 DIAGNOSIS — M542 Cervicalgia: Secondary | ICD-10-CM | POA: Diagnosis not present

## 2018-06-10 DIAGNOSIS — M5412 Radiculopathy, cervical region: Secondary | ICD-10-CM | POA: Diagnosis not present

## 2018-06-10 DIAGNOSIS — R531 Weakness: Secondary | ICD-10-CM | POA: Diagnosis not present

## 2018-06-10 DIAGNOSIS — M2560 Stiffness of unspecified joint, not elsewhere classified: Secondary | ICD-10-CM | POA: Diagnosis not present

## 2018-06-12 DIAGNOSIS — R531 Weakness: Secondary | ICD-10-CM | POA: Diagnosis not present

## 2018-06-12 DIAGNOSIS — M2560 Stiffness of unspecified joint, not elsewhere classified: Secondary | ICD-10-CM | POA: Diagnosis not present

## 2018-06-12 DIAGNOSIS — M542 Cervicalgia: Secondary | ICD-10-CM | POA: Diagnosis not present

## 2018-06-12 DIAGNOSIS — M5412 Radiculopathy, cervical region: Secondary | ICD-10-CM | POA: Diagnosis not present

## 2018-06-13 DIAGNOSIS — E663 Overweight: Secondary | ICD-10-CM | POA: Diagnosis not present

## 2018-06-13 DIAGNOSIS — Z6825 Body mass index (BMI) 25.0-25.9, adult: Secondary | ICD-10-CM | POA: Diagnosis not present

## 2018-06-13 DIAGNOSIS — M5136 Other intervertebral disc degeneration, lumbar region: Secondary | ICD-10-CM | POA: Diagnosis not present

## 2018-06-13 DIAGNOSIS — M150 Primary generalized (osteo)arthritis: Secondary | ICD-10-CM | POA: Diagnosis not present

## 2018-06-13 DIAGNOSIS — M5030 Other cervical disc degeneration, unspecified cervical region: Secondary | ICD-10-CM | POA: Diagnosis not present

## 2018-06-13 DIAGNOSIS — M2550 Pain in unspecified joint: Secondary | ICD-10-CM | POA: Diagnosis not present

## 2018-06-21 DIAGNOSIS — R531 Weakness: Secondary | ICD-10-CM | POA: Diagnosis not present

## 2018-06-21 DIAGNOSIS — M5412 Radiculopathy, cervical region: Secondary | ICD-10-CM | POA: Diagnosis not present

## 2018-06-21 DIAGNOSIS — M2560 Stiffness of unspecified joint, not elsewhere classified: Secondary | ICD-10-CM | POA: Diagnosis not present

## 2018-06-21 DIAGNOSIS — M542 Cervicalgia: Secondary | ICD-10-CM | POA: Diagnosis not present

## 2018-06-24 DIAGNOSIS — J9601 Acute respiratory failure with hypoxia: Secondary | ICD-10-CM | POA: Diagnosis not present

## 2018-06-27 DIAGNOSIS — M792 Neuralgia and neuritis, unspecified: Secondary | ICD-10-CM | POA: Diagnosis not present

## 2018-06-27 DIAGNOSIS — E119 Type 2 diabetes mellitus without complications: Secondary | ICD-10-CM | POA: Diagnosis not present

## 2018-06-27 DIAGNOSIS — M069 Rheumatoid arthritis, unspecified: Secondary | ICD-10-CM | POA: Diagnosis not present

## 2018-06-27 DIAGNOSIS — J449 Chronic obstructive pulmonary disease, unspecified: Secondary | ICD-10-CM | POA: Diagnosis not present

## 2018-06-27 DIAGNOSIS — Z720 Tobacco use: Secondary | ICD-10-CM | POA: Diagnosis not present

## 2018-06-27 DIAGNOSIS — I509 Heart failure, unspecified: Secondary | ICD-10-CM | POA: Diagnosis not present

## 2018-06-27 DIAGNOSIS — G894 Chronic pain syndrome: Secondary | ICD-10-CM | POA: Diagnosis not present

## 2018-06-27 DIAGNOSIS — Z716 Tobacco abuse counseling: Secondary | ICD-10-CM | POA: Diagnosis not present

## 2018-06-27 DIAGNOSIS — G2581 Restless legs syndrome: Secondary | ICD-10-CM | POA: Diagnosis not present

## 2018-06-27 DIAGNOSIS — M545 Low back pain: Secondary | ICD-10-CM | POA: Diagnosis not present

## 2018-06-28 DIAGNOSIS — E1140 Type 2 diabetes mellitus with diabetic neuropathy, unspecified: Secondary | ICD-10-CM | POA: Diagnosis not present

## 2018-06-28 DIAGNOSIS — I10 Essential (primary) hypertension: Secondary | ICD-10-CM | POA: Diagnosis not present

## 2018-06-28 DIAGNOSIS — E1159 Type 2 diabetes mellitus with other circulatory complications: Secondary | ICD-10-CM | POA: Diagnosis not present

## 2018-06-28 DIAGNOSIS — E1165 Type 2 diabetes mellitus with hyperglycemia: Secondary | ICD-10-CM | POA: Diagnosis not present

## 2018-07-17 DIAGNOSIS — R69 Illness, unspecified: Secondary | ICD-10-CM | POA: Diagnosis not present

## 2018-07-25 DIAGNOSIS — J9601 Acute respiratory failure with hypoxia: Secondary | ICD-10-CM | POA: Diagnosis not present

## 2018-08-01 DIAGNOSIS — I509 Heart failure, unspecified: Secondary | ICD-10-CM | POA: Diagnosis not present

## 2018-08-01 DIAGNOSIS — M47816 Spondylosis without myelopathy or radiculopathy, lumbar region: Secondary | ICD-10-CM | POA: Diagnosis not present

## 2018-08-01 DIAGNOSIS — J449 Chronic obstructive pulmonary disease, unspecified: Secondary | ICD-10-CM | POA: Diagnosis not present

## 2018-08-01 DIAGNOSIS — M069 Rheumatoid arthritis, unspecified: Secondary | ICD-10-CM | POA: Diagnosis not present

## 2018-08-01 DIAGNOSIS — Z720 Tobacco use: Secondary | ICD-10-CM | POA: Diagnosis not present

## 2018-08-01 DIAGNOSIS — E119 Type 2 diabetes mellitus without complications: Secondary | ICD-10-CM | POA: Diagnosis not present

## 2018-08-01 DIAGNOSIS — M792 Neuralgia and neuritis, unspecified: Secondary | ICD-10-CM | POA: Diagnosis not present

## 2018-08-01 DIAGNOSIS — G2581 Restless legs syndrome: Secondary | ICD-10-CM | POA: Diagnosis not present

## 2018-08-01 DIAGNOSIS — G894 Chronic pain syndrome: Secondary | ICD-10-CM | POA: Diagnosis not present

## 2018-08-24 DIAGNOSIS — J9601 Acute respiratory failure with hypoxia: Secondary | ICD-10-CM | POA: Diagnosis not present

## 2018-09-04 DIAGNOSIS — I509 Heart failure, unspecified: Secondary | ICD-10-CM | POA: Diagnosis not present

## 2018-09-04 DIAGNOSIS — M792 Neuralgia and neuritis, unspecified: Secondary | ICD-10-CM | POA: Diagnosis not present

## 2018-09-04 DIAGNOSIS — G2581 Restless legs syndrome: Secondary | ICD-10-CM | POA: Diagnosis not present

## 2018-09-04 DIAGNOSIS — E119 Type 2 diabetes mellitus without complications: Secondary | ICD-10-CM | POA: Diagnosis not present

## 2018-09-04 DIAGNOSIS — Z720 Tobacco use: Secondary | ICD-10-CM | POA: Diagnosis not present

## 2018-09-04 DIAGNOSIS — M47816 Spondylosis without myelopathy or radiculopathy, lumbar region: Secondary | ICD-10-CM | POA: Diagnosis not present

## 2018-09-04 DIAGNOSIS — J449 Chronic obstructive pulmonary disease, unspecified: Secondary | ICD-10-CM | POA: Diagnosis not present

## 2018-09-04 DIAGNOSIS — M069 Rheumatoid arthritis, unspecified: Secondary | ICD-10-CM | POA: Diagnosis not present

## 2018-09-04 DIAGNOSIS — G894 Chronic pain syndrome: Secondary | ICD-10-CM | POA: Diagnosis not present

## 2018-09-13 DIAGNOSIS — E663 Overweight: Secondary | ICD-10-CM | POA: Diagnosis not present

## 2018-09-13 DIAGNOSIS — Z6828 Body mass index (BMI) 28.0-28.9, adult: Secondary | ICD-10-CM | POA: Diagnosis not present

## 2018-09-13 DIAGNOSIS — M2550 Pain in unspecified joint: Secondary | ICD-10-CM | POA: Diagnosis not present

## 2018-09-13 DIAGNOSIS — M5136 Other intervertebral disc degeneration, lumbar region: Secondary | ICD-10-CM | POA: Diagnosis not present

## 2018-09-13 DIAGNOSIS — M150 Primary generalized (osteo)arthritis: Secondary | ICD-10-CM | POA: Diagnosis not present

## 2018-09-13 DIAGNOSIS — M5030 Other cervical disc degeneration, unspecified cervical region: Secondary | ICD-10-CM | POA: Diagnosis not present

## 2018-09-24 DIAGNOSIS — J9601 Acute respiratory failure with hypoxia: Secondary | ICD-10-CM | POA: Diagnosis not present

## 2018-10-14 DIAGNOSIS — M542 Cervicalgia: Secondary | ICD-10-CM | POA: Diagnosis not present

## 2018-10-14 DIAGNOSIS — G894 Chronic pain syndrome: Secondary | ICD-10-CM | POA: Diagnosis not present

## 2018-10-14 DIAGNOSIS — M47816 Spondylosis without myelopathy or radiculopathy, lumbar region: Secondary | ICD-10-CM | POA: Diagnosis not present

## 2018-10-14 DIAGNOSIS — Z720 Tobacco use: Secondary | ICD-10-CM | POA: Diagnosis not present

## 2018-10-14 DIAGNOSIS — E119 Type 2 diabetes mellitus without complications: Secondary | ICD-10-CM | POA: Diagnosis not present

## 2018-10-14 DIAGNOSIS — I509 Heart failure, unspecified: Secondary | ICD-10-CM | POA: Diagnosis not present

## 2018-10-14 DIAGNOSIS — J449 Chronic obstructive pulmonary disease, unspecified: Secondary | ICD-10-CM | POA: Diagnosis not present

## 2018-10-14 DIAGNOSIS — G2581 Restless legs syndrome: Secondary | ICD-10-CM | POA: Diagnosis not present

## 2018-10-14 DIAGNOSIS — M069 Rheumatoid arthritis, unspecified: Secondary | ICD-10-CM | POA: Diagnosis not present

## 2018-10-14 DIAGNOSIS — M792 Neuralgia and neuritis, unspecified: Secondary | ICD-10-CM | POA: Diagnosis not present

## 2018-10-24 DIAGNOSIS — J9601 Acute respiratory failure with hypoxia: Secondary | ICD-10-CM | POA: Diagnosis not present

## 2018-11-18 DIAGNOSIS — M47816 Spondylosis without myelopathy or radiculopathy, lumbar region: Secondary | ICD-10-CM | POA: Diagnosis not present

## 2018-11-18 DIAGNOSIS — M4807 Spinal stenosis, lumbosacral region: Secondary | ICD-10-CM | POA: Diagnosis not present

## 2018-11-18 DIAGNOSIS — M4803 Spinal stenosis, cervicothoracic region: Secondary | ICD-10-CM | POA: Diagnosis not present

## 2018-11-18 DIAGNOSIS — M47812 Spondylosis without myelopathy or radiculopathy, cervical region: Secondary | ICD-10-CM | POA: Diagnosis not present

## 2018-11-18 DIAGNOSIS — M542 Cervicalgia: Secondary | ICD-10-CM | POA: Diagnosis not present

## 2018-11-18 DIAGNOSIS — M47817 Spondylosis without myelopathy or radiculopathy, lumbosacral region: Secondary | ICD-10-CM | POA: Diagnosis not present

## 2018-11-20 DIAGNOSIS — G894 Chronic pain syndrome: Secondary | ICD-10-CM | POA: Diagnosis not present

## 2018-11-20 DIAGNOSIS — M47816 Spondylosis without myelopathy or radiculopathy, lumbar region: Secondary | ICD-10-CM | POA: Diagnosis not present

## 2018-11-20 DIAGNOSIS — G2581 Restless legs syndrome: Secondary | ICD-10-CM | POA: Diagnosis not present

## 2018-11-20 DIAGNOSIS — Q761 Klippel-Feil syndrome: Secondary | ICD-10-CM | POA: Diagnosis not present

## 2018-11-20 DIAGNOSIS — Z720 Tobacco use: Secondary | ICD-10-CM | POA: Diagnosis not present

## 2018-11-20 DIAGNOSIS — E119 Type 2 diabetes mellitus without complications: Secondary | ICD-10-CM | POA: Diagnosis not present

## 2018-11-20 DIAGNOSIS — M792 Neuralgia and neuritis, unspecified: Secondary | ICD-10-CM | POA: Diagnosis not present

## 2018-11-20 DIAGNOSIS — M5412 Radiculopathy, cervical region: Secondary | ICD-10-CM | POA: Diagnosis not present

## 2018-11-20 DIAGNOSIS — M5090 Cervical disc disorder, unspecified, unspecified cervical region: Secondary | ICD-10-CM | POA: Diagnosis not present

## 2018-11-20 DIAGNOSIS — M069 Rheumatoid arthritis, unspecified: Secondary | ICD-10-CM | POA: Diagnosis not present

## 2018-12-11 DIAGNOSIS — I10 Essential (primary) hypertension: Secondary | ICD-10-CM | POA: Diagnosis not present

## 2018-12-11 DIAGNOSIS — Z13228 Encounter for screening for other metabolic disorders: Secondary | ICD-10-CM | POA: Diagnosis not present

## 2018-12-11 DIAGNOSIS — E1140 Type 2 diabetes mellitus with diabetic neuropathy, unspecified: Secondary | ICD-10-CM | POA: Diagnosis not present

## 2018-12-11 DIAGNOSIS — E1159 Type 2 diabetes mellitus with other circulatory complications: Secondary | ICD-10-CM | POA: Diagnosis not present

## 2018-12-11 DIAGNOSIS — E1165 Type 2 diabetes mellitus with hyperglycemia: Secondary | ICD-10-CM | POA: Diagnosis not present

## 2018-12-18 DIAGNOSIS — Z720 Tobacco use: Secondary | ICD-10-CM | POA: Diagnosis not present

## 2018-12-18 DIAGNOSIS — I509 Heart failure, unspecified: Secondary | ICD-10-CM | POA: Diagnosis not present

## 2018-12-18 DIAGNOSIS — G894 Chronic pain syndrome: Secondary | ICD-10-CM | POA: Diagnosis not present

## 2018-12-18 DIAGNOSIS — M542 Cervicalgia: Secondary | ICD-10-CM | POA: Diagnosis not present

## 2018-12-18 DIAGNOSIS — G2581 Restless legs syndrome: Secondary | ICD-10-CM | POA: Diagnosis not present

## 2018-12-18 DIAGNOSIS — M069 Rheumatoid arthritis, unspecified: Secondary | ICD-10-CM | POA: Diagnosis not present

## 2018-12-18 DIAGNOSIS — M792 Neuralgia and neuritis, unspecified: Secondary | ICD-10-CM | POA: Diagnosis not present

## 2018-12-18 DIAGNOSIS — E119 Type 2 diabetes mellitus without complications: Secondary | ICD-10-CM | POA: Diagnosis not present

## 2018-12-18 DIAGNOSIS — M47816 Spondylosis without myelopathy or radiculopathy, lumbar region: Secondary | ICD-10-CM | POA: Diagnosis not present

## 2018-12-18 DIAGNOSIS — J449 Chronic obstructive pulmonary disease, unspecified: Secondary | ICD-10-CM | POA: Diagnosis not present

## 2018-12-20 DIAGNOSIS — Z0000 Encounter for general adult medical examination without abnormal findings: Secondary | ICD-10-CM | POA: Diagnosis not present

## 2018-12-20 DIAGNOSIS — E1165 Type 2 diabetes mellitus with hyperglycemia: Secondary | ICD-10-CM | POA: Diagnosis not present

## 2018-12-20 DIAGNOSIS — I509 Heart failure, unspecified: Secondary | ICD-10-CM | POA: Diagnosis not present

## 2018-12-20 DIAGNOSIS — E1159 Type 2 diabetes mellitus with other circulatory complications: Secondary | ICD-10-CM | POA: Diagnosis not present

## 2018-12-20 DIAGNOSIS — Z1331 Encounter for screening for depression: Secondary | ICD-10-CM | POA: Diagnosis not present

## 2018-12-20 DIAGNOSIS — E1140 Type 2 diabetes mellitus with diabetic neuropathy, unspecified: Secondary | ICD-10-CM | POA: Diagnosis not present

## 2018-12-20 DIAGNOSIS — I10 Essential (primary) hypertension: Secondary | ICD-10-CM | POA: Diagnosis not present

## 2018-12-25 DIAGNOSIS — E119 Type 2 diabetes mellitus without complications: Secondary | ICD-10-CM | POA: Diagnosis not present

## 2018-12-25 DIAGNOSIS — J449 Chronic obstructive pulmonary disease, unspecified: Secondary | ICD-10-CM | POA: Diagnosis not present

## 2018-12-25 DIAGNOSIS — M47816 Spondylosis without myelopathy or radiculopathy, lumbar region: Secondary | ICD-10-CM | POA: Diagnosis not present

## 2018-12-25 DIAGNOSIS — I509 Heart failure, unspecified: Secondary | ICD-10-CM | POA: Diagnosis not present

## 2018-12-25 DIAGNOSIS — G894 Chronic pain syndrome: Secondary | ICD-10-CM | POA: Diagnosis not present

## 2018-12-25 DIAGNOSIS — G2581 Restless legs syndrome: Secondary | ICD-10-CM | POA: Diagnosis not present

## 2018-12-25 DIAGNOSIS — Z720 Tobacco use: Secondary | ICD-10-CM | POA: Diagnosis not present

## 2018-12-25 DIAGNOSIS — M542 Cervicalgia: Secondary | ICD-10-CM | POA: Diagnosis not present

## 2018-12-25 DIAGNOSIS — M069 Rheumatoid arthritis, unspecified: Secondary | ICD-10-CM | POA: Diagnosis not present

## 2018-12-25 DIAGNOSIS — M792 Neuralgia and neuritis, unspecified: Secondary | ICD-10-CM | POA: Diagnosis not present

## 2019-01-21 DIAGNOSIS — J449 Chronic obstructive pulmonary disease, unspecified: Secondary | ICD-10-CM | POA: Diagnosis not present

## 2019-01-21 DIAGNOSIS — R69 Illness, unspecified: Secondary | ICD-10-CM | POA: Diagnosis not present

## 2019-01-21 DIAGNOSIS — W57XXXA Bitten or stung by nonvenomous insect and other nonvenomous arthropods, initial encounter: Secondary | ICD-10-CM | POA: Diagnosis not present

## 2019-01-21 DIAGNOSIS — I252 Old myocardial infarction: Secondary | ICD-10-CM | POA: Diagnosis not present

## 2019-01-21 DIAGNOSIS — E119 Type 2 diabetes mellitus without complications: Secondary | ICD-10-CM | POA: Diagnosis not present

## 2019-01-21 DIAGNOSIS — I2510 Atherosclerotic heart disease of native coronary artery without angina pectoris: Secondary | ICD-10-CM | POA: Diagnosis not present

## 2019-01-21 DIAGNOSIS — M069 Rheumatoid arthritis, unspecified: Secondary | ICD-10-CM | POA: Diagnosis not present

## 2019-01-21 DIAGNOSIS — S70251A Superficial foreign body, right hip, initial encounter: Secondary | ICD-10-CM | POA: Diagnosis not present

## 2019-01-21 DIAGNOSIS — I10 Essential (primary) hypertension: Secondary | ICD-10-CM | POA: Diagnosis not present

## 2019-01-21 DIAGNOSIS — Z951 Presence of aortocoronary bypass graft: Secondary | ICD-10-CM | POA: Diagnosis not present

## 2019-01-21 DIAGNOSIS — S70261A Insect bite (nonvenomous), right hip, initial encounter: Secondary | ICD-10-CM | POA: Diagnosis not present

## 2019-01-29 DIAGNOSIS — E1165 Type 2 diabetes mellitus with hyperglycemia: Secondary | ICD-10-CM | POA: Diagnosis not present

## 2019-01-29 DIAGNOSIS — J449 Chronic obstructive pulmonary disease, unspecified: Secondary | ICD-10-CM | POA: Diagnosis not present

## 2019-01-29 DIAGNOSIS — E1140 Type 2 diabetes mellitus with diabetic neuropathy, unspecified: Secondary | ICD-10-CM | POA: Diagnosis not present

## 2019-01-29 DIAGNOSIS — I2510 Atherosclerotic heart disease of native coronary artery without angina pectoris: Secondary | ICD-10-CM | POA: Diagnosis not present

## 2019-02-03 DIAGNOSIS — G2581 Restless legs syndrome: Secondary | ICD-10-CM | POA: Diagnosis not present

## 2019-02-03 DIAGNOSIS — J449 Chronic obstructive pulmonary disease, unspecified: Secondary | ICD-10-CM | POA: Diagnosis not present

## 2019-02-03 DIAGNOSIS — I509 Heart failure, unspecified: Secondary | ICD-10-CM | POA: Diagnosis not present

## 2019-02-03 DIAGNOSIS — M069 Rheumatoid arthritis, unspecified: Secondary | ICD-10-CM | POA: Diagnosis not present

## 2019-02-03 DIAGNOSIS — G894 Chronic pain syndrome: Secondary | ICD-10-CM | POA: Diagnosis not present

## 2019-02-03 DIAGNOSIS — Z720 Tobacco use: Secondary | ICD-10-CM | POA: Diagnosis not present

## 2019-02-03 DIAGNOSIS — M47816 Spondylosis without myelopathy or radiculopathy, lumbar region: Secondary | ICD-10-CM | POA: Diagnosis not present

## 2019-02-03 DIAGNOSIS — M542 Cervicalgia: Secondary | ICD-10-CM | POA: Diagnosis not present

## 2019-02-03 DIAGNOSIS — M792 Neuralgia and neuritis, unspecified: Secondary | ICD-10-CM | POA: Diagnosis not present

## 2019-02-03 DIAGNOSIS — E119 Type 2 diabetes mellitus without complications: Secondary | ICD-10-CM | POA: Diagnosis not present

## 2019-02-27 DIAGNOSIS — I2510 Atherosclerotic heart disease of native coronary artery without angina pectoris: Secondary | ICD-10-CM | POA: Diagnosis not present

## 2019-02-27 DIAGNOSIS — E1165 Type 2 diabetes mellitus with hyperglycemia: Secondary | ICD-10-CM | POA: Diagnosis not present

## 2019-02-27 DIAGNOSIS — E1140 Type 2 diabetes mellitus with diabetic neuropathy, unspecified: Secondary | ICD-10-CM | POA: Diagnosis not present

## 2019-02-27 DIAGNOSIS — J449 Chronic obstructive pulmonary disease, unspecified: Secondary | ICD-10-CM | POA: Diagnosis not present

## 2019-03-05 DIAGNOSIS — G2581 Restless legs syndrome: Secondary | ICD-10-CM | POA: Diagnosis not present

## 2019-03-05 DIAGNOSIS — M542 Cervicalgia: Secondary | ICD-10-CM | POA: Diagnosis not present

## 2019-03-05 DIAGNOSIS — J449 Chronic obstructive pulmonary disease, unspecified: Secondary | ICD-10-CM | POA: Diagnosis not present

## 2019-03-05 DIAGNOSIS — M792 Neuralgia and neuritis, unspecified: Secondary | ICD-10-CM | POA: Diagnosis not present

## 2019-03-05 DIAGNOSIS — E119 Type 2 diabetes mellitus without complications: Secondary | ICD-10-CM | POA: Diagnosis not present

## 2019-03-05 DIAGNOSIS — I509 Heart failure, unspecified: Secondary | ICD-10-CM | POA: Diagnosis not present

## 2019-03-05 DIAGNOSIS — M47816 Spondylosis without myelopathy or radiculopathy, lumbar region: Secondary | ICD-10-CM | POA: Diagnosis not present

## 2019-03-05 DIAGNOSIS — M069 Rheumatoid arthritis, unspecified: Secondary | ICD-10-CM | POA: Diagnosis not present

## 2019-03-05 DIAGNOSIS — G894 Chronic pain syndrome: Secondary | ICD-10-CM | POA: Diagnosis not present

## 2019-03-05 DIAGNOSIS — Z720 Tobacco use: Secondary | ICD-10-CM | POA: Diagnosis not present

## 2019-03-14 DIAGNOSIS — E1159 Type 2 diabetes mellitus with other circulatory complications: Secondary | ICD-10-CM | POA: Diagnosis not present

## 2019-03-14 DIAGNOSIS — E1140 Type 2 diabetes mellitus with diabetic neuropathy, unspecified: Secondary | ICD-10-CM | POA: Diagnosis not present

## 2019-03-14 DIAGNOSIS — Z13220 Encounter for screening for lipoid disorders: Secondary | ICD-10-CM | POA: Diagnosis not present

## 2019-03-21 DIAGNOSIS — E1165 Type 2 diabetes mellitus with hyperglycemia: Secondary | ICD-10-CM | POA: Diagnosis not present

## 2019-03-21 DIAGNOSIS — E1159 Type 2 diabetes mellitus with other circulatory complications: Secondary | ICD-10-CM | POA: Diagnosis not present

## 2019-03-21 DIAGNOSIS — E1140 Type 2 diabetes mellitus with diabetic neuropathy, unspecified: Secondary | ICD-10-CM | POA: Diagnosis not present

## 2019-03-21 DIAGNOSIS — I10 Essential (primary) hypertension: Secondary | ICD-10-CM | POA: Diagnosis not present

## 2019-03-28 DIAGNOSIS — E1159 Type 2 diabetes mellitus with other circulatory complications: Secondary | ICD-10-CM | POA: Diagnosis not present

## 2019-03-28 DIAGNOSIS — I10 Essential (primary) hypertension: Secondary | ICD-10-CM | POA: Diagnosis not present

## 2019-03-28 DIAGNOSIS — E1140 Type 2 diabetes mellitus with diabetic neuropathy, unspecified: Secondary | ICD-10-CM | POA: Diagnosis not present

## 2019-03-28 DIAGNOSIS — E1165 Type 2 diabetes mellitus with hyperglycemia: Secondary | ICD-10-CM | POA: Diagnosis not present

## 2019-04-02 DIAGNOSIS — G894 Chronic pain syndrome: Secondary | ICD-10-CM | POA: Diagnosis not present

## 2019-04-02 DIAGNOSIS — E119 Type 2 diabetes mellitus without complications: Secondary | ICD-10-CM | POA: Diagnosis not present

## 2019-04-02 DIAGNOSIS — Z720 Tobacco use: Secondary | ICD-10-CM | POA: Diagnosis not present

## 2019-04-02 DIAGNOSIS — G2581 Restless legs syndrome: Secondary | ICD-10-CM | POA: Diagnosis not present

## 2019-04-02 DIAGNOSIS — M792 Neuralgia and neuritis, unspecified: Secondary | ICD-10-CM | POA: Diagnosis not present

## 2019-04-02 DIAGNOSIS — M47816 Spondylosis without myelopathy or radiculopathy, lumbar region: Secondary | ICD-10-CM | POA: Diagnosis not present

## 2019-04-02 DIAGNOSIS — I509 Heart failure, unspecified: Secondary | ICD-10-CM | POA: Diagnosis not present

## 2019-04-02 DIAGNOSIS — J449 Chronic obstructive pulmonary disease, unspecified: Secondary | ICD-10-CM | POA: Diagnosis not present

## 2019-04-02 DIAGNOSIS — M542 Cervicalgia: Secondary | ICD-10-CM | POA: Diagnosis not present

## 2019-04-02 DIAGNOSIS — M069 Rheumatoid arthritis, unspecified: Secondary | ICD-10-CM | POA: Diagnosis not present

## 2019-04-29 DIAGNOSIS — E1159 Type 2 diabetes mellitus with other circulatory complications: Secondary | ICD-10-CM | POA: Diagnosis not present

## 2019-04-29 DIAGNOSIS — I10 Essential (primary) hypertension: Secondary | ICD-10-CM | POA: Diagnosis not present

## 2019-04-29 DIAGNOSIS — E1165 Type 2 diabetes mellitus with hyperglycemia: Secondary | ICD-10-CM | POA: Diagnosis not present

## 2019-04-29 DIAGNOSIS — E1140 Type 2 diabetes mellitus with diabetic neuropathy, unspecified: Secondary | ICD-10-CM | POA: Diagnosis not present

## 2019-04-30 DIAGNOSIS — M4802 Spinal stenosis, cervical region: Secondary | ICD-10-CM | POA: Diagnosis not present

## 2019-04-30 DIAGNOSIS — M069 Rheumatoid arthritis, unspecified: Secondary | ICD-10-CM | POA: Diagnosis not present

## 2019-04-30 DIAGNOSIS — Z720 Tobacco use: Secondary | ICD-10-CM | POA: Diagnosis not present

## 2019-04-30 DIAGNOSIS — G894 Chronic pain syndrome: Secondary | ICD-10-CM | POA: Diagnosis not present

## 2019-04-30 DIAGNOSIS — Z1389 Encounter for screening for other disorder: Secondary | ICD-10-CM | POA: Diagnosis not present

## 2019-04-30 DIAGNOSIS — Z981 Arthrodesis status: Secondary | ICD-10-CM | POA: Diagnosis not present

## 2019-04-30 DIAGNOSIS — M4126 Other idiopathic scoliosis, lumbar region: Secondary | ICD-10-CM | POA: Diagnosis not present

## 2019-04-30 DIAGNOSIS — G2581 Restless legs syndrome: Secondary | ICD-10-CM | POA: Diagnosis not present

## 2019-04-30 DIAGNOSIS — M47816 Spondylosis without myelopathy or radiculopathy, lumbar region: Secondary | ICD-10-CM | POA: Diagnosis not present

## 2019-04-30 DIAGNOSIS — G629 Polyneuropathy, unspecified: Secondary | ICD-10-CM | POA: Diagnosis not present

## 2019-05-28 DIAGNOSIS — M47816 Spondylosis without myelopathy or radiculopathy, lumbar region: Secondary | ICD-10-CM | POA: Diagnosis not present

## 2019-05-28 DIAGNOSIS — M4126 Other idiopathic scoliosis, lumbar region: Secondary | ICD-10-CM | POA: Diagnosis not present

## 2019-05-28 DIAGNOSIS — Z1389 Encounter for screening for other disorder: Secondary | ICD-10-CM | POA: Diagnosis not present

## 2019-05-28 DIAGNOSIS — G894 Chronic pain syndrome: Secondary | ICD-10-CM | POA: Diagnosis not present

## 2019-05-28 DIAGNOSIS — Z981 Arthrodesis status: Secondary | ICD-10-CM | POA: Diagnosis not present

## 2019-05-30 DIAGNOSIS — J449 Chronic obstructive pulmonary disease, unspecified: Secondary | ICD-10-CM | POA: Diagnosis not present

## 2019-05-30 DIAGNOSIS — E1140 Type 2 diabetes mellitus with diabetic neuropathy, unspecified: Secondary | ICD-10-CM | POA: Diagnosis not present

## 2019-05-30 DIAGNOSIS — I10 Essential (primary) hypertension: Secondary | ICD-10-CM | POA: Diagnosis not present

## 2019-05-30 DIAGNOSIS — K612 Anorectal abscess: Secondary | ICD-10-CM | POA: Diagnosis not present

## 2019-05-30 DIAGNOSIS — I252 Old myocardial infarction: Secondary | ICD-10-CM | POA: Diagnosis not present

## 2019-05-30 DIAGNOSIS — Z7984 Long term (current) use of oral hypoglycemic drugs: Secondary | ICD-10-CM | POA: Diagnosis not present

## 2019-05-30 DIAGNOSIS — E119 Type 2 diabetes mellitus without complications: Secondary | ICD-10-CM | POA: Diagnosis not present

## 2019-05-30 DIAGNOSIS — E1159 Type 2 diabetes mellitus with other circulatory complications: Secondary | ICD-10-CM | POA: Diagnosis not present

## 2019-05-30 DIAGNOSIS — M1990 Unspecified osteoarthritis, unspecified site: Secondary | ICD-10-CM | POA: Diagnosis not present

## 2019-05-30 DIAGNOSIS — E1165 Type 2 diabetes mellitus with hyperglycemia: Secondary | ICD-10-CM | POA: Diagnosis not present

## 2019-05-30 DIAGNOSIS — Z6824 Body mass index (BMI) 24.0-24.9, adult: Secondary | ICD-10-CM | POA: Diagnosis not present

## 2019-05-30 DIAGNOSIS — K219 Gastro-esophageal reflux disease without esophagitis: Secondary | ICD-10-CM | POA: Diagnosis not present

## 2019-05-30 DIAGNOSIS — Z79899 Other long term (current) drug therapy: Secondary | ICD-10-CM | POA: Diagnosis not present

## 2019-05-30 DIAGNOSIS — K611 Rectal abscess: Secondary | ICD-10-CM | POA: Diagnosis not present

## 2019-05-30 DIAGNOSIS — Z86718 Personal history of other venous thrombosis and embolism: Secondary | ICD-10-CM | POA: Diagnosis not present

## 2019-06-20 DIAGNOSIS — E1140 Type 2 diabetes mellitus with diabetic neuropathy, unspecified: Secondary | ICD-10-CM | POA: Diagnosis not present

## 2019-06-20 DIAGNOSIS — E1159 Type 2 diabetes mellitus with other circulatory complications: Secondary | ICD-10-CM | POA: Diagnosis not present

## 2019-06-27 DIAGNOSIS — I10 Essential (primary) hypertension: Secondary | ICD-10-CM | POA: Diagnosis not present

## 2019-06-27 DIAGNOSIS — E1159 Type 2 diabetes mellitus with other circulatory complications: Secondary | ICD-10-CM | POA: Diagnosis not present

## 2019-06-27 DIAGNOSIS — E1165 Type 2 diabetes mellitus with hyperglycemia: Secondary | ICD-10-CM | POA: Diagnosis not present

## 2019-06-27 DIAGNOSIS — E1140 Type 2 diabetes mellitus with diabetic neuropathy, unspecified: Secondary | ICD-10-CM | POA: Diagnosis not present

## 2019-06-27 DIAGNOSIS — E785 Hyperlipidemia, unspecified: Secondary | ICD-10-CM | POA: Diagnosis not present

## 2019-06-30 DIAGNOSIS — Z981 Arthrodesis status: Secondary | ICD-10-CM | POA: Diagnosis not present

## 2019-06-30 DIAGNOSIS — M4126 Other idiopathic scoliosis, lumbar region: Secondary | ICD-10-CM | POA: Diagnosis not present

## 2019-06-30 DIAGNOSIS — E1140 Type 2 diabetes mellitus with diabetic neuropathy, unspecified: Secondary | ICD-10-CM | POA: Diagnosis not present

## 2019-06-30 DIAGNOSIS — E1165 Type 2 diabetes mellitus with hyperglycemia: Secondary | ICD-10-CM | POA: Diagnosis not present

## 2019-06-30 DIAGNOSIS — M47816 Spondylosis without myelopathy or radiculopathy, lumbar region: Secondary | ICD-10-CM | POA: Diagnosis not present

## 2019-06-30 DIAGNOSIS — E1159 Type 2 diabetes mellitus with other circulatory complications: Secondary | ICD-10-CM | POA: Diagnosis not present

## 2019-06-30 DIAGNOSIS — I10 Essential (primary) hypertension: Secondary | ICD-10-CM | POA: Diagnosis not present

## 2019-06-30 DIAGNOSIS — G894 Chronic pain syndrome: Secondary | ICD-10-CM | POA: Diagnosis not present

## 2019-06-30 DIAGNOSIS — Z1389 Encounter for screening for other disorder: Secondary | ICD-10-CM | POA: Diagnosis not present

## 2019-07-25 DIAGNOSIS — Z9114 Patient's other noncompliance with medication regimen: Secondary | ICD-10-CM | POA: Diagnosis not present

## 2019-07-25 DIAGNOSIS — E1140 Type 2 diabetes mellitus with diabetic neuropathy, unspecified: Secondary | ICD-10-CM | POA: Diagnosis not present

## 2019-07-25 DIAGNOSIS — Z23 Encounter for immunization: Secondary | ICD-10-CM | POA: Diagnosis not present

## 2019-07-25 DIAGNOSIS — E1165 Type 2 diabetes mellitus with hyperglycemia: Secondary | ICD-10-CM | POA: Diagnosis not present

## 2019-07-25 DIAGNOSIS — Z6824 Body mass index (BMI) 24.0-24.9, adult: Secondary | ICD-10-CM | POA: Diagnosis not present

## 2019-07-28 DIAGNOSIS — M47816 Spondylosis without myelopathy or radiculopathy, lumbar region: Secondary | ICD-10-CM | POA: Diagnosis not present

## 2019-07-28 DIAGNOSIS — Z1389 Encounter for screening for other disorder: Secondary | ICD-10-CM | POA: Diagnosis not present

## 2019-07-28 DIAGNOSIS — Z981 Arthrodesis status: Secondary | ICD-10-CM | POA: Diagnosis not present

## 2019-07-28 DIAGNOSIS — M4126 Other idiopathic scoliosis, lumbar region: Secondary | ICD-10-CM | POA: Diagnosis not present

## 2019-07-28 DIAGNOSIS — G894 Chronic pain syndrome: Secondary | ICD-10-CM | POA: Diagnosis not present

## 2019-07-30 DIAGNOSIS — E1165 Type 2 diabetes mellitus with hyperglycemia: Secondary | ICD-10-CM | POA: Diagnosis not present

## 2019-07-30 DIAGNOSIS — E1159 Type 2 diabetes mellitus with other circulatory complications: Secondary | ICD-10-CM | POA: Diagnosis not present

## 2019-07-30 DIAGNOSIS — E1140 Type 2 diabetes mellitus with diabetic neuropathy, unspecified: Secondary | ICD-10-CM | POA: Diagnosis not present

## 2019-07-30 DIAGNOSIS — I10 Essential (primary) hypertension: Secondary | ICD-10-CM | POA: Diagnosis not present

## 2019-08-29 DIAGNOSIS — E785 Hyperlipidemia, unspecified: Secondary | ICD-10-CM | POA: Diagnosis not present

## 2019-08-29 DIAGNOSIS — Z6824 Body mass index (BMI) 24.0-24.9, adult: Secondary | ICD-10-CM | POA: Diagnosis not present

## 2019-08-29 DIAGNOSIS — E1165 Type 2 diabetes mellitus with hyperglycemia: Secondary | ICD-10-CM | POA: Diagnosis not present

## 2019-08-29 DIAGNOSIS — E1140 Type 2 diabetes mellitus with diabetic neuropathy, unspecified: Secondary | ICD-10-CM | POA: Diagnosis not present

## 2019-08-29 DIAGNOSIS — I10 Essential (primary) hypertension: Secondary | ICD-10-CM | POA: Diagnosis not present

## 2019-09-02 DIAGNOSIS — E1165 Type 2 diabetes mellitus with hyperglycemia: Secondary | ICD-10-CM | POA: Diagnosis not present

## 2019-09-02 DIAGNOSIS — E1140 Type 2 diabetes mellitus with diabetic neuropathy, unspecified: Secondary | ICD-10-CM | POA: Diagnosis not present

## 2019-09-02 DIAGNOSIS — E1159 Type 2 diabetes mellitus with other circulatory complications: Secondary | ICD-10-CM | POA: Diagnosis not present

## 2019-09-02 DIAGNOSIS — I10 Essential (primary) hypertension: Secondary | ICD-10-CM | POA: Diagnosis not present

## 2019-09-29 DIAGNOSIS — E1140 Type 2 diabetes mellitus with diabetic neuropathy, unspecified: Secondary | ICD-10-CM | POA: Diagnosis not present

## 2019-09-29 DIAGNOSIS — E785 Hyperlipidemia, unspecified: Secondary | ICD-10-CM | POA: Diagnosis not present

## 2019-09-29 DIAGNOSIS — I10 Essential (primary) hypertension: Secondary | ICD-10-CM | POA: Diagnosis not present

## 2019-09-29 DIAGNOSIS — E1165 Type 2 diabetes mellitus with hyperglycemia: Secondary | ICD-10-CM | POA: Diagnosis not present

## 2019-10-13 DIAGNOSIS — E785 Hyperlipidemia, unspecified: Secondary | ICD-10-CM | POA: Diagnosis not present

## 2019-10-13 DIAGNOSIS — E1159 Type 2 diabetes mellitus with other circulatory complications: Secondary | ICD-10-CM | POA: Diagnosis not present

## 2019-10-20 DIAGNOSIS — I10 Essential (primary) hypertension: Secondary | ICD-10-CM | POA: Diagnosis not present

## 2019-10-20 DIAGNOSIS — E1159 Type 2 diabetes mellitus with other circulatory complications: Secondary | ICD-10-CM | POA: Diagnosis not present

## 2019-10-20 DIAGNOSIS — E1165 Type 2 diabetes mellitus with hyperglycemia: Secondary | ICD-10-CM | POA: Diagnosis not present

## 2019-10-20 DIAGNOSIS — E1140 Type 2 diabetes mellitus with diabetic neuropathy, unspecified: Secondary | ICD-10-CM | POA: Diagnosis not present

## 2019-10-29 DIAGNOSIS — E1159 Type 2 diabetes mellitus with other circulatory complications: Secondary | ICD-10-CM | POA: Diagnosis not present

## 2019-10-29 DIAGNOSIS — E1140 Type 2 diabetes mellitus with diabetic neuropathy, unspecified: Secondary | ICD-10-CM | POA: Diagnosis not present

## 2019-10-29 DIAGNOSIS — I10 Essential (primary) hypertension: Secondary | ICD-10-CM | POA: Diagnosis not present

## 2019-10-29 DIAGNOSIS — E1165 Type 2 diabetes mellitus with hyperglycemia: Secondary | ICD-10-CM | POA: Diagnosis not present

## 2019-11-21 DIAGNOSIS — Z6826 Body mass index (BMI) 26.0-26.9, adult: Secondary | ICD-10-CM | POA: Diagnosis not present

## 2019-11-21 DIAGNOSIS — G5790 Unspecified mononeuropathy of unspecified lower limb: Secondary | ICD-10-CM | POA: Diagnosis not present

## 2019-11-28 DIAGNOSIS — I10 Essential (primary) hypertension: Secondary | ICD-10-CM | POA: Diagnosis not present

## 2019-11-28 DIAGNOSIS — E1165 Type 2 diabetes mellitus with hyperglycemia: Secondary | ICD-10-CM | POA: Diagnosis not present

## 2019-11-28 DIAGNOSIS — E1159 Type 2 diabetes mellitus with other circulatory complications: Secondary | ICD-10-CM | POA: Diagnosis not present

## 2019-11-28 DIAGNOSIS — E1140 Type 2 diabetes mellitus with diabetic neuropathy, unspecified: Secondary | ICD-10-CM | POA: Diagnosis not present

## 2019-12-11 DIAGNOSIS — Z6825 Body mass index (BMI) 25.0-25.9, adult: Secondary | ICD-10-CM | POA: Diagnosis not present

## 2019-12-11 DIAGNOSIS — L089 Local infection of the skin and subcutaneous tissue, unspecified: Secondary | ICD-10-CM | POA: Diagnosis not present

## 2019-12-22 DIAGNOSIS — R69 Illness, unspecified: Secondary | ICD-10-CM | POA: Diagnosis not present

## 2019-12-22 DIAGNOSIS — Z0000 Encounter for general adult medical examination without abnormal findings: Secondary | ICD-10-CM | POA: Diagnosis not present

## 2019-12-22 DIAGNOSIS — Z136 Encounter for screening for cardiovascular disorders: Secondary | ICD-10-CM | POA: Diagnosis not present

## 2019-12-22 DIAGNOSIS — Z1331 Encounter for screening for depression: Secondary | ICD-10-CM | POA: Diagnosis not present

## 2019-12-22 DIAGNOSIS — Z139 Encounter for screening, unspecified: Secondary | ICD-10-CM | POA: Diagnosis not present

## 2019-12-22 DIAGNOSIS — Q644 Malformation of urachus: Secondary | ICD-10-CM | POA: Diagnosis not present

## 2019-12-22 DIAGNOSIS — Z1339 Encounter for screening examination for other mental health and behavioral disorders: Secondary | ICD-10-CM | POA: Diagnosis not present

## 2019-12-22 DIAGNOSIS — Z7189 Other specified counseling: Secondary | ICD-10-CM | POA: Diagnosis not present

## 2019-12-28 DIAGNOSIS — E1140 Type 2 diabetes mellitus with diabetic neuropathy, unspecified: Secondary | ICD-10-CM | POA: Diagnosis not present

## 2019-12-28 DIAGNOSIS — E1159 Type 2 diabetes mellitus with other circulatory complications: Secondary | ICD-10-CM | POA: Diagnosis not present

## 2019-12-28 DIAGNOSIS — I10 Essential (primary) hypertension: Secondary | ICD-10-CM | POA: Diagnosis not present

## 2019-12-28 DIAGNOSIS — E1165 Type 2 diabetes mellitus with hyperglycemia: Secondary | ICD-10-CM | POA: Diagnosis not present

## 2020-01-13 DIAGNOSIS — E1159 Type 2 diabetes mellitus with other circulatory complications: Secondary | ICD-10-CM | POA: Diagnosis not present

## 2020-01-13 DIAGNOSIS — Z125 Encounter for screening for malignant neoplasm of prostate: Secondary | ICD-10-CM | POA: Diagnosis not present

## 2020-01-13 DIAGNOSIS — E785 Hyperlipidemia, unspecified: Secondary | ICD-10-CM | POA: Diagnosis not present

## 2020-01-19 DIAGNOSIS — E1140 Type 2 diabetes mellitus with diabetic neuropathy, unspecified: Secondary | ICD-10-CM | POA: Diagnosis not present

## 2020-01-19 DIAGNOSIS — E1159 Type 2 diabetes mellitus with other circulatory complications: Secondary | ICD-10-CM | POA: Diagnosis not present

## 2020-01-19 DIAGNOSIS — E1165 Type 2 diabetes mellitus with hyperglycemia: Secondary | ICD-10-CM | POA: Diagnosis not present

## 2020-01-19 DIAGNOSIS — I152 Hypertension secondary to endocrine disorders: Secondary | ICD-10-CM | POA: Diagnosis not present

## 2020-01-28 DIAGNOSIS — E1159 Type 2 diabetes mellitus with other circulatory complications: Secondary | ICD-10-CM | POA: Diagnosis not present

## 2020-01-28 DIAGNOSIS — E1140 Type 2 diabetes mellitus with diabetic neuropathy, unspecified: Secondary | ICD-10-CM | POA: Diagnosis not present

## 2020-01-28 DIAGNOSIS — E1165 Type 2 diabetes mellitus with hyperglycemia: Secondary | ICD-10-CM | POA: Diagnosis not present

## 2020-01-28 DIAGNOSIS — I152 Hypertension secondary to endocrine disorders: Secondary | ICD-10-CM | POA: Diagnosis not present

## 2020-02-04 DIAGNOSIS — G64 Other disorders of peripheral nervous system: Secondary | ICD-10-CM | POA: Diagnosis not present

## 2020-02-04 DIAGNOSIS — R200 Anesthesia of skin: Secondary | ICD-10-CM | POA: Diagnosis not present

## 2020-02-04 DIAGNOSIS — G5762 Lesion of plantar nerve, left lower limb: Secondary | ICD-10-CM | POA: Diagnosis not present

## 2020-02-04 DIAGNOSIS — G5761 Lesion of plantar nerve, right lower limb: Secondary | ICD-10-CM | POA: Diagnosis not present

## 2020-02-04 DIAGNOSIS — R2689 Other abnormalities of gait and mobility: Secondary | ICD-10-CM | POA: Diagnosis not present

## 2020-02-27 DIAGNOSIS — I152 Hypertension secondary to endocrine disorders: Secondary | ICD-10-CM | POA: Diagnosis not present

## 2020-02-27 DIAGNOSIS — E1140 Type 2 diabetes mellitus with diabetic neuropathy, unspecified: Secondary | ICD-10-CM | POA: Diagnosis not present

## 2020-02-27 DIAGNOSIS — E1165 Type 2 diabetes mellitus with hyperglycemia: Secondary | ICD-10-CM | POA: Diagnosis not present

## 2020-02-27 DIAGNOSIS — E1159 Type 2 diabetes mellitus with other circulatory complications: Secondary | ICD-10-CM | POA: Diagnosis not present

## 2020-03-01 DIAGNOSIS — M19072 Primary osteoarthritis, left ankle and foot: Secondary | ICD-10-CM | POA: Diagnosis not present

## 2020-03-01 DIAGNOSIS — M7989 Other specified soft tissue disorders: Secondary | ICD-10-CM | POA: Diagnosis not present

## 2020-03-01 DIAGNOSIS — M25572 Pain in left ankle and joints of left foot: Secondary | ICD-10-CM | POA: Diagnosis not present

## 2020-03-01 DIAGNOSIS — M79672 Pain in left foot: Secondary | ICD-10-CM | POA: Diagnosis not present

## 2020-03-29 DIAGNOSIS — E1159 Type 2 diabetes mellitus with other circulatory complications: Secondary | ICD-10-CM | POA: Diagnosis not present

## 2020-03-29 DIAGNOSIS — I152 Hypertension secondary to endocrine disorders: Secondary | ICD-10-CM | POA: Diagnosis not present

## 2020-03-29 DIAGNOSIS — E1165 Type 2 diabetes mellitus with hyperglycemia: Secondary | ICD-10-CM | POA: Diagnosis not present

## 2020-03-29 DIAGNOSIS — E1140 Type 2 diabetes mellitus with diabetic neuropathy, unspecified: Secondary | ICD-10-CM | POA: Diagnosis not present

## 2020-03-30 DIAGNOSIS — M7989 Other specified soft tissue disorders: Secondary | ICD-10-CM | POA: Diagnosis not present

## 2020-03-30 DIAGNOSIS — Z6826 Body mass index (BMI) 26.0-26.9, adult: Secondary | ICD-10-CM | POA: Diagnosis not present

## 2020-04-06 DIAGNOSIS — M069 Rheumatoid arthritis, unspecified: Secondary | ICD-10-CM | POA: Diagnosis not present

## 2020-04-06 DIAGNOSIS — M130 Polyarthritis, unspecified: Secondary | ICD-10-CM | POA: Diagnosis not present

## 2020-04-06 DIAGNOSIS — Z6826 Body mass index (BMI) 26.0-26.9, adult: Secondary | ICD-10-CM | POA: Diagnosis not present

## 2020-04-06 DIAGNOSIS — M7989 Other specified soft tissue disorders: Secondary | ICD-10-CM | POA: Diagnosis not present

## 2020-05-10 DIAGNOSIS — Q644 Malformation of urachus: Secondary | ICD-10-CM | POA: Diagnosis not present

## 2020-05-10 DIAGNOSIS — Z6826 Body mass index (BMI) 26.0-26.9, adult: Secondary | ICD-10-CM | POA: Diagnosis not present

## 2020-05-18 DIAGNOSIS — Q644 Malformation of urachus: Secondary | ICD-10-CM | POA: Diagnosis not present

## 2020-06-08 DIAGNOSIS — Z1159 Encounter for screening for other viral diseases: Secondary | ICD-10-CM | POA: Diagnosis not present

## 2020-06-08 DIAGNOSIS — Z1152 Encounter for screening for COVID-19: Secondary | ICD-10-CM | POA: Diagnosis not present

## 2020-06-14 DIAGNOSIS — E119 Type 2 diabetes mellitus without complications: Secondary | ICD-10-CM | POA: Diagnosis not present

## 2020-06-14 DIAGNOSIS — R69 Illness, unspecified: Secondary | ICD-10-CM | POA: Diagnosis not present

## 2020-06-14 DIAGNOSIS — J449 Chronic obstructive pulmonary disease, unspecified: Secondary | ICD-10-CM | POA: Diagnosis not present

## 2020-06-14 DIAGNOSIS — M069 Rheumatoid arthritis, unspecified: Secondary | ICD-10-CM | POA: Diagnosis not present

## 2020-06-14 DIAGNOSIS — Z981 Arthrodesis status: Secondary | ICD-10-CM | POA: Diagnosis not present

## 2020-06-14 DIAGNOSIS — I252 Old myocardial infarction: Secondary | ICD-10-CM | POA: Diagnosis not present

## 2020-06-14 DIAGNOSIS — Q644 Malformation of urachus: Secondary | ICD-10-CM | POA: Diagnosis not present

## 2020-06-14 DIAGNOSIS — I10 Essential (primary) hypertension: Secondary | ICD-10-CM | POA: Diagnosis not present

## 2020-06-14 DIAGNOSIS — K429 Umbilical hernia without obstruction or gangrene: Secondary | ICD-10-CM | POA: Diagnosis not present

## 2020-06-14 DIAGNOSIS — E785 Hyperlipidemia, unspecified: Secondary | ICD-10-CM | POA: Diagnosis not present

## 2020-06-14 DIAGNOSIS — K219 Gastro-esophageal reflux disease without esophagitis: Secondary | ICD-10-CM | POA: Diagnosis not present

## 2020-06-15 DIAGNOSIS — E1165 Type 2 diabetes mellitus with hyperglycemia: Secondary | ICD-10-CM | POA: Diagnosis not present

## 2020-06-15 DIAGNOSIS — E785 Hyperlipidemia, unspecified: Secondary | ICD-10-CM | POA: Diagnosis not present

## 2020-06-15 DIAGNOSIS — Z9119 Patient's noncompliance with other medical treatment and regimen: Secondary | ICD-10-CM | POA: Diagnosis not present

## 2020-06-15 DIAGNOSIS — E1140 Type 2 diabetes mellitus with diabetic neuropathy, unspecified: Secondary | ICD-10-CM | POA: Diagnosis not present

## 2020-06-22 DIAGNOSIS — Z09 Encounter for follow-up examination after completed treatment for conditions other than malignant neoplasm: Secondary | ICD-10-CM | POA: Diagnosis not present

## 2020-06-25 DIAGNOSIS — Z09 Encounter for follow-up examination after completed treatment for conditions other than malignant neoplasm: Secondary | ICD-10-CM | POA: Diagnosis not present

## 2020-09-21 DIAGNOSIS — E1140 Type 2 diabetes mellitus with diabetic neuropathy, unspecified: Secondary | ICD-10-CM | POA: Diagnosis not present

## 2020-09-21 DIAGNOSIS — E1165 Type 2 diabetes mellitus with hyperglycemia: Secondary | ICD-10-CM | POA: Diagnosis not present

## 2020-11-10 DIAGNOSIS — R051 Acute cough: Secondary | ICD-10-CM | POA: Diagnosis not present

## 2020-11-10 DIAGNOSIS — Z20828 Contact with and (suspected) exposure to other viral communicable diseases: Secondary | ICD-10-CM | POA: Diagnosis not present

## 2020-11-10 DIAGNOSIS — J209 Acute bronchitis, unspecified: Secondary | ICD-10-CM | POA: Diagnosis not present

## 2020-11-10 DIAGNOSIS — J3489 Other specified disorders of nose and nasal sinuses: Secondary | ICD-10-CM | POA: Diagnosis not present

## 2021-01-03 DIAGNOSIS — E1140 Type 2 diabetes mellitus with diabetic neuropathy, unspecified: Secondary | ICD-10-CM | POA: Diagnosis not present

## 2021-01-03 DIAGNOSIS — E785 Hyperlipidemia, unspecified: Secondary | ICD-10-CM | POA: Diagnosis not present

## 2021-01-10 DIAGNOSIS — E1140 Type 2 diabetes mellitus with diabetic neuropathy, unspecified: Secondary | ICD-10-CM | POA: Diagnosis not present

## 2021-01-10 DIAGNOSIS — I2510 Atherosclerotic heart disease of native coronary artery without angina pectoris: Secondary | ICD-10-CM | POA: Diagnosis not present

## 2021-01-10 DIAGNOSIS — E1165 Type 2 diabetes mellitus with hyperglycemia: Secondary | ICD-10-CM | POA: Diagnosis not present

## 2021-01-10 DIAGNOSIS — E785 Hyperlipidemia, unspecified: Secondary | ICD-10-CM | POA: Diagnosis not present

## 2021-02-07 DIAGNOSIS — E1140 Type 2 diabetes mellitus with diabetic neuropathy, unspecified: Secondary | ICD-10-CM | POA: Diagnosis not present

## 2021-02-07 DIAGNOSIS — Z6825 Body mass index (BMI) 25.0-25.9, adult: Secondary | ICD-10-CM | POA: Diagnosis not present

## 2021-02-18 DIAGNOSIS — Q644 Malformation of urachus: Secondary | ICD-10-CM | POA: Diagnosis not present

## 2021-02-18 DIAGNOSIS — Z6825 Body mass index (BMI) 25.0-25.9, adult: Secondary | ICD-10-CM | POA: Diagnosis not present

## 2021-02-25 DIAGNOSIS — Q644 Malformation of urachus: Secondary | ICD-10-CM | POA: Diagnosis not present

## 2021-02-25 DIAGNOSIS — Z6825 Body mass index (BMI) 25.0-25.9, adult: Secondary | ICD-10-CM | POA: Diagnosis not present

## 2021-03-09 DIAGNOSIS — Z6826 Body mass index (BMI) 26.0-26.9, adult: Secondary | ICD-10-CM | POA: Diagnosis not present

## 2021-03-09 DIAGNOSIS — E1140 Type 2 diabetes mellitus with diabetic neuropathy, unspecified: Secondary | ICD-10-CM | POA: Diagnosis not present

## 2021-03-24 DIAGNOSIS — E1159 Type 2 diabetes mellitus with other circulatory complications: Secondary | ICD-10-CM | POA: Diagnosis not present

## 2021-03-30 DIAGNOSIS — Z6825 Body mass index (BMI) 25.0-25.9, adult: Secondary | ICD-10-CM | POA: Diagnosis not present

## 2021-03-30 DIAGNOSIS — T300 Burn of unspecified body region, unspecified degree: Secondary | ICD-10-CM | POA: Diagnosis not present

## 2021-04-23 DIAGNOSIS — E1159 Type 2 diabetes mellitus with other circulatory complications: Secondary | ICD-10-CM | POA: Diagnosis not present

## 2021-05-23 DIAGNOSIS — E1159 Type 2 diabetes mellitus with other circulatory complications: Secondary | ICD-10-CM | POA: Diagnosis not present

## 2021-06-16 DIAGNOSIS — R509 Fever, unspecified: Secondary | ICD-10-CM | POA: Diagnosis not present

## 2021-06-16 DIAGNOSIS — U071 COVID-19: Secondary | ICD-10-CM | POA: Diagnosis not present

## 2021-06-16 DIAGNOSIS — Z20822 Contact with and (suspected) exposure to covid-19: Secondary | ICD-10-CM | POA: Diagnosis not present

## 2021-06-30 DIAGNOSIS — E1159 Type 2 diabetes mellitus with other circulatory complications: Secondary | ICD-10-CM | POA: Diagnosis not present

## 2021-07-12 DIAGNOSIS — Z125 Encounter for screening for malignant neoplasm of prostate: Secondary | ICD-10-CM | POA: Diagnosis not present

## 2021-07-12 DIAGNOSIS — E785 Hyperlipidemia, unspecified: Secondary | ICD-10-CM | POA: Diagnosis not present

## 2021-07-20 DIAGNOSIS — Z139 Encounter for screening, unspecified: Secondary | ICD-10-CM | POA: Diagnosis not present

## 2021-07-20 DIAGNOSIS — E785 Hyperlipidemia, unspecified: Secondary | ICD-10-CM | POA: Diagnosis not present

## 2021-07-20 DIAGNOSIS — Z1331 Encounter for screening for depression: Secondary | ICD-10-CM | POA: Diagnosis not present

## 2021-07-20 DIAGNOSIS — I2510 Atherosclerotic heart disease of native coronary artery without angina pectoris: Secondary | ICD-10-CM | POA: Diagnosis not present

## 2021-07-20 DIAGNOSIS — Z6825 Body mass index (BMI) 25.0-25.9, adult: Secondary | ICD-10-CM | POA: Diagnosis not present

## 2021-07-20 DIAGNOSIS — Q644 Malformation of urachus: Secondary | ICD-10-CM | POA: Diagnosis not present

## 2021-07-20 DIAGNOSIS — Z136 Encounter for screening for cardiovascular disorders: Secondary | ICD-10-CM | POA: Diagnosis not present

## 2021-07-20 DIAGNOSIS — Z0000 Encounter for general adult medical examination without abnormal findings: Secondary | ICD-10-CM | POA: Diagnosis not present

## 2021-07-20 DIAGNOSIS — E1159 Type 2 diabetes mellitus with other circulatory complications: Secondary | ICD-10-CM | POA: Diagnosis not present

## 2021-07-20 DIAGNOSIS — I152 Hypertension secondary to endocrine disorders: Secondary | ICD-10-CM | POA: Diagnosis not present

## 2021-07-22 DIAGNOSIS — R69 Illness, unspecified: Secondary | ICD-10-CM | POA: Diagnosis not present

## 2021-07-22 DIAGNOSIS — E119 Type 2 diabetes mellitus without complications: Secondary | ICD-10-CM | POA: Diagnosis not present

## 2021-07-22 DIAGNOSIS — I700 Atherosclerosis of aorta: Secondary | ICD-10-CM | POA: Diagnosis not present

## 2021-07-22 DIAGNOSIS — J449 Chronic obstructive pulmonary disease, unspecified: Secondary | ICD-10-CM | POA: Diagnosis not present

## 2021-07-22 DIAGNOSIS — I509 Heart failure, unspecified: Secondary | ICD-10-CM | POA: Diagnosis not present

## 2021-07-22 DIAGNOSIS — M160 Bilateral primary osteoarthritis of hip: Secondary | ICD-10-CM | POA: Diagnosis not present

## 2021-07-22 DIAGNOSIS — I110 Hypertensive heart disease with heart failure: Secondary | ICD-10-CM | POA: Diagnosis not present

## 2021-07-22 DIAGNOSIS — R109 Unspecified abdominal pain: Secondary | ICD-10-CM | POA: Diagnosis not present

## 2021-07-22 DIAGNOSIS — I2510 Atherosclerotic heart disease of native coronary artery without angina pectoris: Secondary | ICD-10-CM | POA: Diagnosis not present

## 2021-07-22 DIAGNOSIS — M069 Rheumatoid arthritis, unspecified: Secondary | ICD-10-CM | POA: Diagnosis not present

## 2021-07-22 DIAGNOSIS — L02216 Cutaneous abscess of umbilicus: Secondary | ICD-10-CM | POA: Diagnosis not present

## 2021-07-22 DIAGNOSIS — E785 Hyperlipidemia, unspecified: Secondary | ICD-10-CM | POA: Diagnosis not present

## 2021-07-22 DIAGNOSIS — R1905 Periumbilic swelling, mass or lump: Secondary | ICD-10-CM | POA: Diagnosis not present

## 2021-07-22 DIAGNOSIS — R1033 Periumbilical pain: Secondary | ICD-10-CM | POA: Diagnosis not present

## 2021-07-23 DIAGNOSIS — L02216 Cutaneous abscess of umbilicus: Secondary | ICD-10-CM | POA: Diagnosis not present

## 2021-07-23 DIAGNOSIS — E785 Hyperlipidemia, unspecified: Secondary | ICD-10-CM | POA: Diagnosis not present

## 2021-07-23 DIAGNOSIS — E119 Type 2 diabetes mellitus without complications: Secondary | ICD-10-CM | POA: Diagnosis not present

## 2021-07-24 DIAGNOSIS — L02216 Cutaneous abscess of umbilicus: Secondary | ICD-10-CM | POA: Diagnosis not present

## 2021-07-24 DIAGNOSIS — E119 Type 2 diabetes mellitus without complications: Secondary | ICD-10-CM | POA: Diagnosis not present

## 2021-07-24 DIAGNOSIS — E785 Hyperlipidemia, unspecified: Secondary | ICD-10-CM | POA: Diagnosis not present

## 2021-07-27 DIAGNOSIS — L02216 Cutaneous abscess of umbilicus: Secondary | ICD-10-CM | POA: Diagnosis not present

## 2021-07-30 DIAGNOSIS — I2510 Atherosclerotic heart disease of native coronary artery without angina pectoris: Secondary | ICD-10-CM | POA: Diagnosis not present

## 2021-07-30 DIAGNOSIS — J449 Chronic obstructive pulmonary disease, unspecified: Secondary | ICD-10-CM | POA: Diagnosis not present

## 2021-07-30 DIAGNOSIS — I509 Heart failure, unspecified: Secondary | ICD-10-CM | POA: Diagnosis not present

## 2021-07-30 DIAGNOSIS — E1159 Type 2 diabetes mellitus with other circulatory complications: Secondary | ICD-10-CM | POA: Diagnosis not present

## 2021-08-02 DIAGNOSIS — L02216 Cutaneous abscess of umbilicus: Secondary | ICD-10-CM | POA: Diagnosis not present

## 2021-08-09 DIAGNOSIS — L02216 Cutaneous abscess of umbilicus: Secondary | ICD-10-CM | POA: Diagnosis not present

## 2021-08-25 DIAGNOSIS — Z951 Presence of aortocoronary bypass graft: Secondary | ICD-10-CM | POA: Diagnosis not present

## 2021-08-25 DIAGNOSIS — I509 Heart failure, unspecified: Secondary | ICD-10-CM | POA: Diagnosis not present

## 2021-08-25 DIAGNOSIS — R69 Illness, unspecified: Secondary | ICD-10-CM | POA: Diagnosis not present

## 2021-08-25 DIAGNOSIS — Z7902 Long term (current) use of antithrombotics/antiplatelets: Secondary | ICD-10-CM | POA: Diagnosis not present

## 2021-08-25 DIAGNOSIS — I110 Hypertensive heart disease with heart failure: Secondary | ICD-10-CM | POA: Diagnosis not present

## 2021-08-25 DIAGNOSIS — E119 Type 2 diabetes mellitus without complications: Secondary | ICD-10-CM | POA: Diagnosis not present

## 2021-08-25 DIAGNOSIS — Z7984 Long term (current) use of oral hypoglycemic drugs: Secondary | ICD-10-CM | POA: Diagnosis not present

## 2021-08-25 DIAGNOSIS — L905 Scar conditions and fibrosis of skin: Secondary | ICD-10-CM | POA: Diagnosis not present

## 2021-08-25 DIAGNOSIS — L02216 Cutaneous abscess of umbilicus: Secondary | ICD-10-CM | POA: Diagnosis not present

## 2021-08-29 DIAGNOSIS — E1159 Type 2 diabetes mellitus with other circulatory complications: Secondary | ICD-10-CM | POA: Diagnosis not present

## 2021-08-30 DIAGNOSIS — J449 Chronic obstructive pulmonary disease, unspecified: Secondary | ICD-10-CM | POA: Diagnosis not present

## 2021-08-30 DIAGNOSIS — I509 Heart failure, unspecified: Secondary | ICD-10-CM | POA: Diagnosis not present

## 2021-08-30 DIAGNOSIS — I2510 Atherosclerotic heart disease of native coronary artery without angina pectoris: Secondary | ICD-10-CM | POA: Diagnosis not present

## 2021-09-29 DIAGNOSIS — E1140 Type 2 diabetes mellitus with diabetic neuropathy, unspecified: Secondary | ICD-10-CM | POA: Diagnosis not present

## 2021-09-29 DIAGNOSIS — E1159 Type 2 diabetes mellitus with other circulatory complications: Secondary | ICD-10-CM | POA: Diagnosis not present

## 2021-10-30 DIAGNOSIS — I509 Heart failure, unspecified: Secondary | ICD-10-CM | POA: Diagnosis not present

## 2021-10-30 DIAGNOSIS — J449 Chronic obstructive pulmonary disease, unspecified: Secondary | ICD-10-CM | POA: Diagnosis not present

## 2021-10-30 DIAGNOSIS — I2510 Atherosclerotic heart disease of native coronary artery without angina pectoris: Secondary | ICD-10-CM | POA: Diagnosis not present

## 2021-10-30 DIAGNOSIS — E1159 Type 2 diabetes mellitus with other circulatory complications: Secondary | ICD-10-CM | POA: Diagnosis not present

## 2021-11-17 DIAGNOSIS — E1165 Type 2 diabetes mellitus with hyperglycemia: Secondary | ICD-10-CM | POA: Diagnosis not present

## 2021-11-23 DIAGNOSIS — Z139 Encounter for screening, unspecified: Secondary | ICD-10-CM | POA: Diagnosis not present

## 2021-11-23 DIAGNOSIS — I509 Heart failure, unspecified: Secondary | ICD-10-CM | POA: Diagnosis not present

## 2021-11-23 DIAGNOSIS — E1165 Type 2 diabetes mellitus with hyperglycemia: Secondary | ICD-10-CM | POA: Diagnosis not present

## 2021-11-23 DIAGNOSIS — I2510 Atherosclerotic heart disease of native coronary artery without angina pectoris: Secondary | ICD-10-CM | POA: Diagnosis not present

## 2021-11-23 DIAGNOSIS — Z794 Long term (current) use of insulin: Secondary | ICD-10-CM | POA: Diagnosis not present

## 2021-11-23 DIAGNOSIS — Z1331 Encounter for screening for depression: Secondary | ICD-10-CM | POA: Diagnosis not present

## 2021-11-30 DIAGNOSIS — E1159 Type 2 diabetes mellitus with other circulatory complications: Secondary | ICD-10-CM | POA: Diagnosis not present

## 2021-11-30 DIAGNOSIS — J449 Chronic obstructive pulmonary disease, unspecified: Secondary | ICD-10-CM | POA: Diagnosis not present

## 2021-11-30 DIAGNOSIS — I509 Heart failure, unspecified: Secondary | ICD-10-CM | POA: Diagnosis not present

## 2021-11-30 DIAGNOSIS — I2510 Atherosclerotic heart disease of native coronary artery without angina pectoris: Secondary | ICD-10-CM | POA: Diagnosis not present

## 2021-12-26 DIAGNOSIS — E1159 Type 2 diabetes mellitus with other circulatory complications: Secondary | ICD-10-CM | POA: Diagnosis not present

## 2021-12-28 DIAGNOSIS — I509 Heart failure, unspecified: Secondary | ICD-10-CM | POA: Diagnosis not present

## 2021-12-28 DIAGNOSIS — E1165 Type 2 diabetes mellitus with hyperglycemia: Secondary | ICD-10-CM | POA: Diagnosis not present

## 2021-12-28 DIAGNOSIS — J449 Chronic obstructive pulmonary disease, unspecified: Secondary | ICD-10-CM | POA: Diagnosis not present

## 2021-12-28 DIAGNOSIS — I2510 Atherosclerotic heart disease of native coronary artery without angina pectoris: Secondary | ICD-10-CM | POA: Diagnosis not present

## 2022-01-25 DIAGNOSIS — E1159 Type 2 diabetes mellitus with other circulatory complications: Secondary | ICD-10-CM | POA: Diagnosis not present

## 2022-01-28 DIAGNOSIS — J449 Chronic obstructive pulmonary disease, unspecified: Secondary | ICD-10-CM | POA: Diagnosis not present

## 2022-01-28 DIAGNOSIS — E785 Hyperlipidemia, unspecified: Secondary | ICD-10-CM | POA: Diagnosis not present

## 2022-02-21 DIAGNOSIS — E1165 Type 2 diabetes mellitus with hyperglycemia: Secondary | ICD-10-CM | POA: Diagnosis not present

## 2022-02-24 DIAGNOSIS — E1159 Type 2 diabetes mellitus with other circulatory complications: Secondary | ICD-10-CM | POA: Diagnosis not present

## 2022-02-27 DIAGNOSIS — J449 Chronic obstructive pulmonary disease, unspecified: Secondary | ICD-10-CM | POA: Diagnosis not present

## 2022-02-27 DIAGNOSIS — E785 Hyperlipidemia, unspecified: Secondary | ICD-10-CM | POA: Diagnosis not present

## 2022-03-01 DIAGNOSIS — I509 Heart failure, unspecified: Secondary | ICD-10-CM | POA: Diagnosis not present

## 2022-03-01 DIAGNOSIS — E785 Hyperlipidemia, unspecified: Secondary | ICD-10-CM | POA: Diagnosis not present

## 2022-03-01 DIAGNOSIS — E1159 Type 2 diabetes mellitus with other circulatory complications: Secondary | ICD-10-CM | POA: Diagnosis not present

## 2022-03-01 DIAGNOSIS — I152 Hypertension secondary to endocrine disorders: Secondary | ICD-10-CM | POA: Diagnosis not present

## 2022-03-30 DIAGNOSIS — J449 Chronic obstructive pulmonary disease, unspecified: Secondary | ICD-10-CM | POA: Diagnosis not present

## 2022-03-30 DIAGNOSIS — E785 Hyperlipidemia, unspecified: Secondary | ICD-10-CM | POA: Diagnosis not present

## 2022-04-03 DIAGNOSIS — E1159 Type 2 diabetes mellitus with other circulatory complications: Secondary | ICD-10-CM | POA: Diagnosis not present

## 2022-04-29 DIAGNOSIS — J449 Chronic obstructive pulmonary disease, unspecified: Secondary | ICD-10-CM | POA: Diagnosis not present

## 2022-04-29 DIAGNOSIS — E785 Hyperlipidemia, unspecified: Secondary | ICD-10-CM | POA: Diagnosis not present

## 2022-05-03 DIAGNOSIS — E1159 Type 2 diabetes mellitus with other circulatory complications: Secondary | ICD-10-CM | POA: Diagnosis not present

## 2022-05-30 DIAGNOSIS — J449 Chronic obstructive pulmonary disease, unspecified: Secondary | ICD-10-CM | POA: Diagnosis not present

## 2022-05-30 DIAGNOSIS — E785 Hyperlipidemia, unspecified: Secondary | ICD-10-CM | POA: Diagnosis not present

## 2022-06-02 DIAGNOSIS — E1159 Type 2 diabetes mellitus with other circulatory complications: Secondary | ICD-10-CM | POA: Diagnosis not present

## 2022-06-14 DIAGNOSIS — S3992XA Unspecified injury of lower back, initial encounter: Secondary | ICD-10-CM | POA: Diagnosis not present

## 2022-06-14 DIAGNOSIS — Z6826 Body mass index (BMI) 26.0-26.9, adult: Secondary | ICD-10-CM | POA: Diagnosis not present

## 2022-06-15 DIAGNOSIS — W06XXXA Fall from bed, initial encounter: Secondary | ICD-10-CM | POA: Diagnosis not present

## 2022-06-15 DIAGNOSIS — S3210XA Unspecified fracture of sacrum, initial encounter for closed fracture: Secondary | ICD-10-CM | POA: Diagnosis not present

## 2022-06-15 DIAGNOSIS — M533 Sacrococcygeal disorders, not elsewhere classified: Secondary | ICD-10-CM | POA: Diagnosis not present

## 2022-06-15 DIAGNOSIS — S3992XA Unspecified injury of lower back, initial encounter: Secondary | ICD-10-CM | POA: Diagnosis not present

## 2022-06-28 NOTE — Progress Notes (Deleted)
Office Visit Note  Patient: Andrew Russo             Date of Birth: 1956/05/18           MRN: [id number]             PCP: Charlott Rakes, MD Referring: Charlott Rakes, MD Visit Date: 06/29/2022 Occupation: @GUAROCC @  Subjective:  No chief complaint on file.   History of Present Illness: Andrew Russo is a 66 y.o. male here for evaluation and treatment of rheumatoid arthritis. He was originally diagnosed with seronegative RA in 2014 in 2015 after development of bilateral hand pain and swelling and took methotrexate about 2 months total before moving. He saw Dr. 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 It looks like he again lacked rheumatology follow up for the past few years. He has had uncontrolled diabetes with soft tissue abscess complication.***   Activities of Daily Living:  Patient reports morning stiffness for *** {minute/hour:19697}.   Patient {ACTIONS;DENIES/REPORTS:21021675::"Denies"} nocturnal pain.  Difficulty dressing/grooming: {ACTIONS;DENIES/REPORTS:21021675::"Denies"} Difficulty climbing stairs: {ACTIONS;DENIES/REPORTS:21021675::"Denies"} Difficulty getting out of chair: {ACTIONS;DENIES/REPORTS:21021675::"Denies"} Difficulty using hands for taps, buttons, cutlery, and/or writing: {ACTIONS;DENIES/REPORTS:21021675::"Denies"}  No Rheumatology ROS completed.   PMFS History:  There are no problems to display for this patient.   Past Medical History:  Diagnosis Date   Diabetes mellitus without complication (HCC)    Hypertension    MI (myocardial infarction) (HCC)    PE (pulmonary embolism)     No family history on file. Past Surgical History:  Procedure Laterality Date   BACK SURGERY     neck fusion     REPLACEMENT TOTAL KNEE     SHOULDER SURGERY      Social History   Social History Narrative   Not on file    There is no immunization history on file for this patient.   Objective: Vital Signs: There were no vitals taken for this visit.   Physical Exam   Musculoskeletal Exam: ***  CDAI Exam: CDAI Score: -- Patient Global: --; Provider Global: -- Swollen: --; Tender: -- Joint Exam 06/29/2022   No joint exam has been documented for this visit   There is currently no information documented on the homunculus. Go to the Rheumatology activity and complete the homunculus joint exam.  Investigation: No additional findings.  Imaging: No results found.  Recent Labs: Lab Results  Component Value Date   WBC 6.6 03/26/2016   HGB 12.2 (L) 03/26/2016   PLT 262 03/26/2016   NA 134 (L) 03/26/2016   K 4.5 03/26/2016   CL 97 (L) 03/26/2016   GLUCOSE 552 (HH) 03/26/2016   BUN 15 03/26/2016   CREATININE 0.50 (L) 03/26/2016    Speciality Comments: No specialty comments available.  Procedures:  No procedures performed Allergies: Patient has no known allergies.   Assessment / Plan:     Visit Diagnoses: No diagnosis found.  Orders: No orders of the defined types were placed in this encounter.  No orders of the defined types were placed in this encounter.   Face-to-face time spent with patient was *** minutes. Greater than 50% of time was spent in counseling and coordination of care.  Follow-Up Instructions: No follow-ups on file.   03/28/2016, MD  Note - This record has been created using [employer] Plan.  Chart creation errors have been sought, but may  not always  have been located. Such creation errors do not reflect on  the standard of medical care.  

## 2022-06-29 ENCOUNTER — Ambulatory Visit: Payer: Medicare HMO | Attending: Internal Medicine | Admitting: Internal Medicine

## 2022-06-30 DIAGNOSIS — M069 Rheumatoid arthritis, unspecified: Secondary | ICD-10-CM | POA: Diagnosis not present

## 2022-06-30 DIAGNOSIS — J449 Chronic obstructive pulmonary disease, unspecified: Secondary | ICD-10-CM | POA: Diagnosis not present

## 2022-07-30 DIAGNOSIS — J449 Chronic obstructive pulmonary disease, unspecified: Secondary | ICD-10-CM | POA: Diagnosis not present

## 2022-07-30 DIAGNOSIS — M069 Rheumatoid arthritis, unspecified: Secondary | ICD-10-CM | POA: Diagnosis not present

## 2022-08-09 DIAGNOSIS — Z125 Encounter for screening for malignant neoplasm of prostate: Secondary | ICD-10-CM | POA: Diagnosis not present

## 2022-08-09 DIAGNOSIS — E1165 Type 2 diabetes mellitus with hyperglycemia: Secondary | ICD-10-CM | POA: Diagnosis not present

## 2022-08-09 DIAGNOSIS — Z23 Encounter for immunization: Secondary | ICD-10-CM | POA: Diagnosis not present

## 2022-08-09 DIAGNOSIS — E1159 Type 2 diabetes mellitus with other circulatory complications: Secondary | ICD-10-CM | POA: Diagnosis not present

## 2022-08-09 DIAGNOSIS — E785 Hyperlipidemia, unspecified: Secondary | ICD-10-CM | POA: Diagnosis not present

## 2022-08-15 DIAGNOSIS — Z1339 Encounter for screening examination for other mental health and behavioral disorders: Secondary | ICD-10-CM | POA: Diagnosis not present

## 2022-08-15 DIAGNOSIS — F17200 Nicotine dependence, unspecified, uncomplicated: Secondary | ICD-10-CM | POA: Diagnosis not present

## 2022-08-15 DIAGNOSIS — E1165 Type 2 diabetes mellitus with hyperglycemia: Secondary | ICD-10-CM | POA: Diagnosis not present

## 2022-08-15 DIAGNOSIS — R69 Illness, unspecified: Secondary | ICD-10-CM | POA: Diagnosis not present

## 2022-08-15 DIAGNOSIS — E785 Hyperlipidemia, unspecified: Secondary | ICD-10-CM | POA: Diagnosis not present

## 2022-08-15 DIAGNOSIS — F17210 Nicotine dependence, cigarettes, uncomplicated: Secondary | ICD-10-CM | POA: Diagnosis not present

## 2022-08-15 DIAGNOSIS — Z23 Encounter for immunization: Secondary | ICD-10-CM | POA: Diagnosis not present

## 2022-08-15 DIAGNOSIS — Z136 Encounter for screening for cardiovascular disorders: Secondary | ICD-10-CM | POA: Diagnosis not present

## 2022-08-15 DIAGNOSIS — Z6827 Body mass index (BMI) 27.0-27.9, adult: Secondary | ICD-10-CM | POA: Diagnosis not present

## 2022-08-15 DIAGNOSIS — Z1331 Encounter for screening for depression: Secondary | ICD-10-CM | POA: Diagnosis not present

## 2022-08-15 DIAGNOSIS — I152 Hypertension secondary to endocrine disorders: Secondary | ICD-10-CM | POA: Diagnosis not present

## 2022-08-15 DIAGNOSIS — Z0000 Encounter for general adult medical examination without abnormal findings: Secondary | ICD-10-CM | POA: Diagnosis not present

## 2022-08-15 DIAGNOSIS — E1159 Type 2 diabetes mellitus with other circulatory complications: Secondary | ICD-10-CM | POA: Diagnosis not present

## 2022-08-15 DIAGNOSIS — Z139 Encounter for screening, unspecified: Secondary | ICD-10-CM | POA: Diagnosis not present

## 2022-08-29 ENCOUNTER — Ambulatory Visit: Payer: Medicare HMO | Admitting: Podiatry

## 2022-08-29 DIAGNOSIS — M79674 Pain in right toe(s): Secondary | ICD-10-CM

## 2022-08-29 DIAGNOSIS — M79675 Pain in left toe(s): Secondary | ICD-10-CM | POA: Diagnosis not present

## 2022-08-29 DIAGNOSIS — B353 Tinea pedis: Secondary | ICD-10-CM | POA: Diagnosis not present

## 2022-08-29 DIAGNOSIS — M792 Neuralgia and neuritis, unspecified: Secondary | ICD-10-CM | POA: Diagnosis not present

## 2022-08-29 DIAGNOSIS — E1142 Type 2 diabetes mellitus with diabetic polyneuropathy: Secondary | ICD-10-CM

## 2022-08-29 DIAGNOSIS — B351 Tinea unguium: Secondary | ICD-10-CM | POA: Diagnosis not present

## 2022-08-29 MED ORDER — KETOCONAZOLE 2 % EX CREA: 1.0000 | TOPICAL_CREAM | Freq: Every day | CUTANEOUS | 0 refills | Status: DC

## 2022-08-29 MED ORDER — GABAPENTIN 300 MG PO CAPS: 300.0000 mg | ORAL_CAPSULE | Freq: Three times a day (TID) | ORAL | 3 refills | Status: DC

## 2022-08-29 NOTE — Progress Notes (Unsigned)
Subjective:  Patient ID: Andrew Russo, male    DOB: 1956-01-01,  MRN: [id number]  Chief Complaint  Patient presents with   Numbness    Numbness in both feet-constant. Neuropathy. Does mot take any medication. Started near 2016. Patient is diabetic. Blood sugar was 122 this morning.    Foot Problem    L foot had a crack on the bottom on the arch near the lateral aspect of foot. Patient skin was peeling. Feet does not itch.    Nail Problem    Nail fungus bilateral feet. Has not tried any medication.     66 y.o. male presents with concern for bilateral foot numbness in both feet as well as neuropathic pain including burning tingling pins and needle sensation.  Does not take any medication for this problem.  He also had a left foot plantar wound near the lateral aspect of the foot in the arch but it is since healed up.  He also has noted dry red skin on both feet does not use any foot lotion or antifungal sprays or lotions.  Also concerned that his nails are discolored growing abnormally and difficult to trim.  Past Medical History:  Diagnosis Date   Diabetes mellitus without complication (HCC)    Hypertension    MI (myocardial infarction) ([place])    PE (pulmonary embolism)     No Known Allergies  ROS: Negative except as per HPI above  Objective:  General: AAO x3, NAD  Dermatological: Red spotty rash and xerosis of the skin bilateral plantar foot in moccasin distribution.  Nails x5 both feet with discoloration thickening and dystrophic growth related to fungal infection.  Vascular:  Dorsalis Pedis artery and Posterior Tibial artery pedal pulses are 2/4 bilateral.  Capillary fill time < 3 sec to all digits.   Neruologic: Grossly diminished via light touch bilateral forefoot.  Musculoskeletal: No gross boney pedal deformities bilateral. No pain, crepitus, or limitation noted with foot and ankle range of motion bilateral. Muscular strength 5/5 in all groups tested bilateral.  Gait:  Unassisted, Nonantalgic.   No images are attached to the encounter.  Assessment:   1. Pain due to onychomycosis of toenails of both feet   2. Tinea pedis of both feet   3. Neuropathic pain   4. DM type 2 with diabetic peripheral neuropathy ([place])      Plan:  Patient was evaluated and treated and all questions answered.  Patient educated on diabetes. Discussed proper diabetic foot care and discussed risks and complications of disease. Educated patient in depth on reasons to return to the office immediately should he/she discover anything concerning or new on the feet. All questions answered. Discussed proper shoes as well.   #Onychomycosis with pain  -Nails palliatively debrided as below. -Educated on self-care  Procedure: Nail Debridement Rationale: Pain Type of Debridement: manual, sharp debridement. Instrumentation: Nail nipper, rotary burr. Number of Nails: 10  #Tinea pedis bilateral  Discussed the etiology and treatment options for tinea pedis.  Discussed topical and oral treatment.  Recommended topical treatment with 2% ketoconazole cream.  This was sent to the patient's pharmacy.  Also discussed appropriate foot hygiene, use of antifungal spray such as Tinactin in shoes, as well as cleaning her foot surfaces such as showers and bathroom floors with bleach.  # nueropathic pain - Recommend the patient be started on a course of gabapentin 300 mg TID for next 3 mo. - begin by taking just one per day and titrating the dose from there. -  Patient aware of possible risks and side effects of this medication.    Return in about 3 months (around 11/29/2022) for [facility], follow up neuropathy/tinea pedis.          Corinna Gab, DPM [facility] / [facility]

## 2022-08-30 DIAGNOSIS — J449 Chronic obstructive pulmonary disease, unspecified: Secondary | ICD-10-CM | POA: Diagnosis not present

## 2022-08-30 DIAGNOSIS — M069 Rheumatoid arthritis, unspecified: Secondary | ICD-10-CM | POA: Diagnosis not present

## 2022-09-18 DIAGNOSIS — E1159 Type 2 diabetes mellitus with other circulatory complications: Secondary | ICD-10-CM | POA: Diagnosis not present

## 2022-09-29 DIAGNOSIS — J449 Chronic obstructive pulmonary disease, unspecified: Secondary | ICD-10-CM | POA: Diagnosis not present

## 2022-09-29 DIAGNOSIS — E1159 Type 2 diabetes mellitus with other circulatory complications: Secondary | ICD-10-CM | POA: Diagnosis not present

## 2022-10-18 DIAGNOSIS — E1159 Type 2 diabetes mellitus with other circulatory complications: Secondary | ICD-10-CM | POA: Diagnosis not present

## 2022-10-31 DIAGNOSIS — R051 Acute cough: Secondary | ICD-10-CM | POA: Diagnosis not present

## 2022-10-31 DIAGNOSIS — J441 Chronic obstructive pulmonary disease with (acute) exacerbation: Secondary | ICD-10-CM | POA: Diagnosis not present

## 2022-10-31 DIAGNOSIS — F17210 Nicotine dependence, cigarettes, uncomplicated: Secondary | ICD-10-CM | POA: Diagnosis not present

## 2022-10-31 DIAGNOSIS — R69 Illness, unspecified: Secondary | ICD-10-CM | POA: Diagnosis not present

## 2022-10-31 DIAGNOSIS — Z20822 Contact with and (suspected) exposure to covid-19: Secondary | ICD-10-CM | POA: Diagnosis not present

## 2022-10-31 DIAGNOSIS — F17200 Nicotine dependence, unspecified, uncomplicated: Secondary | ICD-10-CM | POA: Diagnosis not present

## 2022-10-31 DIAGNOSIS — Z139 Encounter for screening, unspecified: Secondary | ICD-10-CM | POA: Diagnosis not present

## 2022-11-10 DIAGNOSIS — E1159 Type 2 diabetes mellitus with other circulatory complications: Secondary | ICD-10-CM | POA: Diagnosis not present

## 2022-11-10 DIAGNOSIS — E1165 Type 2 diabetes mellitus with hyperglycemia: Secondary | ICD-10-CM | POA: Diagnosis not present

## 2022-11-10 DIAGNOSIS — E785 Hyperlipidemia, unspecified: Secondary | ICD-10-CM | POA: Diagnosis not present

## 2022-11-16 DIAGNOSIS — F17200 Nicotine dependence, unspecified, uncomplicated: Secondary | ICD-10-CM | POA: Diagnosis not present

## 2022-11-16 DIAGNOSIS — E1159 Type 2 diabetes mellitus with other circulatory complications: Secondary | ICD-10-CM | POA: Diagnosis not present

## 2022-11-16 DIAGNOSIS — Z139 Encounter for screening, unspecified: Secondary | ICD-10-CM | POA: Diagnosis not present

## 2022-11-16 DIAGNOSIS — E785 Hyperlipidemia, unspecified: Secondary | ICD-10-CM | POA: Diagnosis not present

## 2022-11-16 DIAGNOSIS — I152 Hypertension secondary to endocrine disorders: Secondary | ICD-10-CM | POA: Diagnosis not present

## 2022-11-16 DIAGNOSIS — F17210 Nicotine dependence, cigarettes, uncomplicated: Secondary | ICD-10-CM | POA: Diagnosis not present

## 2022-11-16 DIAGNOSIS — Z6827 Body mass index (BMI) 27.0-27.9, adult: Secondary | ICD-10-CM | POA: Diagnosis not present

## 2022-11-16 DIAGNOSIS — R69 Illness, unspecified: Secondary | ICD-10-CM | POA: Diagnosis not present

## 2022-11-16 DIAGNOSIS — I5030 Unspecified diastolic (congestive) heart failure: Secondary | ICD-10-CM | POA: Diagnosis not present

## 2022-11-17 DIAGNOSIS — E1159 Type 2 diabetes mellitus with other circulatory complications: Secondary | ICD-10-CM | POA: Diagnosis not present

## 2022-11-27 DIAGNOSIS — Z1331 Encounter for screening for depression: Secondary | ICD-10-CM | POA: Diagnosis not present

## 2022-11-27 DIAGNOSIS — Z6827 Body mass index (BMI) 27.0-27.9, adult: Secondary | ICD-10-CM | POA: Diagnosis not present

## 2022-11-27 DIAGNOSIS — Z139 Encounter for screening, unspecified: Secondary | ICD-10-CM | POA: Diagnosis not present

## 2022-11-27 DIAGNOSIS — Z1211 Encounter for screening for malignant neoplasm of colon: Secondary | ICD-10-CM | POA: Diagnosis not present

## 2022-11-27 DIAGNOSIS — Z0000 Encounter for general adult medical examination without abnormal findings: Secondary | ICD-10-CM | POA: Diagnosis not present

## 2022-11-30 DIAGNOSIS — M069 Rheumatoid arthritis, unspecified: Secondary | ICD-10-CM | POA: Diagnosis not present

## 2022-11-30 DIAGNOSIS — E1159 Type 2 diabetes mellitus with other circulatory complications: Secondary | ICD-10-CM | POA: Diagnosis not present

## 2022-12-05 ENCOUNTER — Ambulatory Visit (INDEPENDENT_AMBULATORY_CARE_PROVIDER_SITE_OTHER): Payer: Medicare HMO | Admitting: Podiatry

## 2022-12-05 DIAGNOSIS — Z91199 Patient's noncompliance with other medical treatment and regimen due to unspecified reason: Secondary | ICD-10-CM

## 2022-12-05 NOTE — Progress Notes (Signed)
Pt was a no show for apt, charge generated 

## 2022-12-22 DIAGNOSIS — R296 Repeated falls: Secondary | ICD-10-CM | POA: Diagnosis not present

## 2022-12-22 DIAGNOSIS — F17210 Nicotine dependence, cigarettes, uncomplicated: Secondary | ICD-10-CM | POA: Diagnosis not present

## 2022-12-22 DIAGNOSIS — Z8739 Personal history of other diseases of the musculoskeletal system and connective tissue: Secondary | ICD-10-CM | POA: Diagnosis not present

## 2022-12-22 DIAGNOSIS — E1140 Type 2 diabetes mellitus with diabetic neuropathy, unspecified: Secondary | ICD-10-CM | POA: Diagnosis not present

## 2022-12-22 DIAGNOSIS — Z6827 Body mass index (BMI) 27.0-27.9, adult: Secondary | ICD-10-CM | POA: Diagnosis not present

## 2022-12-22 DIAGNOSIS — G5790 Unspecified mononeuropathy of unspecified lower limb: Secondary | ICD-10-CM | POA: Diagnosis not present

## 2022-12-22 DIAGNOSIS — I152 Hypertension secondary to endocrine disorders: Secondary | ICD-10-CM | POA: Diagnosis not present

## 2022-12-22 DIAGNOSIS — E1159 Type 2 diabetes mellitus with other circulatory complications: Secondary | ICD-10-CM | POA: Diagnosis not present

## 2022-12-22 DIAGNOSIS — R69 Illness, unspecified: Secondary | ICD-10-CM | POA: Diagnosis not present

## 2022-12-25 ENCOUNTER — Other Ambulatory Visit: Payer: Self-pay | Admitting: Podiatry

## 2022-12-29 DIAGNOSIS — E1140 Type 2 diabetes mellitus with diabetic neuropathy, unspecified: Secondary | ICD-10-CM | POA: Diagnosis not present

## 2022-12-29 DIAGNOSIS — M069 Rheumatoid arthritis, unspecified: Secondary | ICD-10-CM | POA: Diagnosis not present

## 2023-01-04 DIAGNOSIS — I700 Atherosclerosis of aorta: Secondary | ICD-10-CM | POA: Diagnosis not present

## 2023-01-04 DIAGNOSIS — Z79899 Other long term (current) drug therapy: Secondary | ICD-10-CM | POA: Diagnosis not present

## 2023-01-04 DIAGNOSIS — M25562 Pain in left knee: Secondary | ICD-10-CM | POA: Diagnosis not present

## 2023-01-04 DIAGNOSIS — M545 Low back pain, unspecified: Secondary | ICD-10-CM | POA: Diagnosis not present

## 2023-01-04 DIAGNOSIS — M2392 Unspecified internal derangement of left knee: Secondary | ICD-10-CM | POA: Diagnosis not present

## 2023-01-04 DIAGNOSIS — S83412A Sprain of medial collateral ligament of left knee, initial encounter: Secondary | ICD-10-CM | POA: Diagnosis not present

## 2023-01-04 DIAGNOSIS — M25462 Effusion, left knee: Secondary | ICD-10-CM | POA: Diagnosis not present

## 2023-01-04 DIAGNOSIS — W19XXXA Unspecified fall, initial encounter: Secondary | ICD-10-CM | POA: Diagnosis not present

## 2023-01-04 DIAGNOSIS — R109 Unspecified abdominal pain: Secondary | ICD-10-CM | POA: Diagnosis not present

## 2023-01-12 ENCOUNTER — Other Ambulatory Visit: Payer: Self-pay

## 2023-01-12 DIAGNOSIS — Z8739 Personal history of other diseases of the musculoskeletal system and connective tissue: Secondary | ICD-10-CM | POA: Diagnosis not present

## 2023-01-12 DIAGNOSIS — R296 Repeated falls: Secondary | ICD-10-CM | POA: Diagnosis not present

## 2023-01-12 DIAGNOSIS — I152 Hypertension secondary to endocrine disorders: Secondary | ICD-10-CM

## 2023-01-12 DIAGNOSIS — Z6827 Body mass index (BMI) 27.0-27.9, adult: Secondary | ICD-10-CM | POA: Diagnosis not present

## 2023-01-15 ENCOUNTER — Telehealth: Payer: Self-pay

## 2023-01-15 ENCOUNTER — Telehealth: Payer: Self-pay | Admitting: *Deleted

## 2023-01-15 NOTE — Telephone Encounter (Signed)
   Telephone encounter was:  Unsuccessful.  01/15/2023 Name: Andrew Russo MRN: [id number] DOB: 1956-03-14  Unsuccessful outbound call made today to assist with:  Food Insecurity  Outreach Attempt:  1st Attempt  A HIPAA compliant voice message was left requesting a return call.  Instructed patient to call back     [facility] [phone] [address], [place], [place] [address] Phone: [phone] Email: [email or web address]

## 2023-01-15 NOTE — Progress Notes (Signed)
[place] [facility]) [place]   01/15/2023  Carron Nee Dec 17, 1955 [id number]   Reason for referral: Medication Assistance with Farxiga  Referral source: Dr. Nyra Capes Current insurance: Holland Falling  Reason for call: Medication Assistance  Outreach:  Unsuccessful telephone call attempt #1 to patient.   HIPAA compliant voicemail left requesting a return call  Plan:  -I will make another outreach attempt to patient within 3-4 business days.   Thanks,  Reed Breech, [place] [phone]

## 2023-01-15 NOTE — Progress Notes (Signed)
  Care Coordination  Outreach Note  01/15/2023 Name: Elder Szafran MRN: [id number] DOB: [birth year]   Care Coordination Outreach Attempts: An unsuccessful telephone outreach was attempted today to offer the patient information about available care coordination services as a benefit of their health plan.   Follow Up Plan:  Additional outreach attempts will be made to offer the patient care coordination information and services.   Encounter Outcome:  No Answer  Julian Hy, [place] Direct Dial: [phone]

## 2023-01-15 NOTE — Progress Notes (Signed)
  Care Coordination   Note   01/15/2023 Name: Andrew Russo MRN: [id number] DOB: Jun 17, 1956  Andrew Russo is a 67 y.o. year old male who sees Maryella Shivers, MD for primary care. I reached out to Greer Pickerel by phone today to offer care coordination services.  [name] was given information about Care Coordination services today including:   The Care Coordination services include support from the care team which includes your Nurse Coordinator, Clinical Social Worker, or Pharmacist.  The Care Coordination team is here to help remove barriers to the health concerns and goals most important to you. Care Coordination services are voluntary, and the patient may decline or stop services at any time by request to their care team member.   Care Coordination Consent Status: Patient agreed to services and verbal consent obtained.   Follow up plan:  Telephone appointment with care coordination team member scheduled for:  01/17/2023  Encounter Outcome:  Pt. Scheduled from referral   Julian Hy, [place] Direct Dial: [phone]

## 2023-01-16 ENCOUNTER — Telehealth: Payer: Self-pay

## 2023-01-16 NOTE — Telephone Encounter (Signed)
   Telephone encounter was:  Successful.  01/16/2023 Name: Andrew Russo MRN: [id number] DOB: [birth year]  Camille Shillito is a 67 y.o. year old male who is a primary care patient of Maryella Shivers, MD . The community resource team was consulted for assistance with Food Insecurity and Financial Difficulties related to [place] guide performed the following interventions: Patient provided with information about care guide support team and interviewed to confirm resource needs.Patient stated he cant afford food after paying for all the bills and goes weeks with little to no food. I was able to give food resources over the phone as well as mailed resources to his home   Follow Up Plan:  No further follow up planned at this time. The patient has been provided with needed resources.    [employer] [phone] [address], [place], [place] [address] Phone: [phone] Email: [email or web address]

## 2023-01-17 ENCOUNTER — Ambulatory Visit: Payer: Self-pay

## 2023-01-17 DIAGNOSIS — R531 Weakness: Secondary | ICD-10-CM | POA: Diagnosis not present

## 2023-01-17 DIAGNOSIS — R296 Repeated falls: Secondary | ICD-10-CM | POA: Diagnosis not present

## 2023-01-17 NOTE — Patient Outreach (Signed)
  Care Coordination   Initial Visit Note   01/17/2023 Name: Andrew Russo MRN: [id number] DOB: 1956-09-25  Couper Joyal is a 67 y.o. year old male who sees Maryella Shivers, MD for primary care. I spoke with  Greer Pickerel by phone today.  What matters to the patients health and wellness today?  Placed call to patient and reviewed our scheduled appointment.  Patient had forgotten.   Reviewed patients concern of medications cost.   After reviewing EMR noted that pharmacist had called patient and left a message. Provided patient with name and contact number for pharmacist. Reviewed care guide assistance with food resources and patient states that he has no additional needs.  Reviewed with patient MD referral was to assist with DM control. Reviewed A1c with patient.  Patient states to me that he has been a diabetic for 45 years and knows what to do. Reports he takes his medications as prescribed. Reports that he over " indulgences" with food and he knows that he shouldn't.  Offered multiple times to assist with DM education and patient refused.   SDOH assessments and interventions completed:  No     Care Coordination Interventions:  Yes, provided   Follow up plan: No further intervention required.   Encounter Outcome:  Pt. Visit Completed   Tomasa Rand, RN, BSN, CEN [place] Coordinator [phone]

## 2023-01-26 ENCOUNTER — Telehealth: Payer: Self-pay

## 2023-01-26 NOTE — Progress Notes (Signed)
[place] [facility]) [place]   01/26/2023  Andrew Russo 1955/12/08 [id number]   Reason for referral: Medication Assistance with Farxiga  Referral source: Dr. Nyra Capes Current insurance: Holland Falling  Reason for call: Medication Assistance   Outreach:  Unsuccessful telephone call attempt #2 to patient.   HIPAA compliant voicemail left requesting a return call  Plan:  -I will make another outreach attempt to patient within 3-4 business days.   Thanks,  Reed Breech, [place] [phone]

## 2023-01-29 DIAGNOSIS — R531 Weakness: Secondary | ICD-10-CM | POA: Diagnosis not present

## 2023-01-29 DIAGNOSIS — M545 Low back pain, unspecified: Secondary | ICD-10-CM | POA: Diagnosis not present

## 2023-01-29 DIAGNOSIS — E1140 Type 2 diabetes mellitus with diabetic neuropathy, unspecified: Secondary | ICD-10-CM | POA: Diagnosis not present

## 2023-01-29 DIAGNOSIS — M2560 Stiffness of unspecified joint, not elsewhere classified: Secondary | ICD-10-CM | POA: Diagnosis not present

## 2023-01-29 DIAGNOSIS — R296 Repeated falls: Secondary | ICD-10-CM | POA: Diagnosis not present

## 2023-01-29 DIAGNOSIS — M069 Rheumatoid arthritis, unspecified: Secondary | ICD-10-CM | POA: Diagnosis not present

## 2023-01-31 DIAGNOSIS — R531 Weakness: Secondary | ICD-10-CM | POA: Diagnosis not present

## 2023-01-31 DIAGNOSIS — M2560 Stiffness of unspecified joint, not elsewhere classified: Secondary | ICD-10-CM | POA: Diagnosis not present

## 2023-01-31 DIAGNOSIS — R296 Repeated falls: Secondary | ICD-10-CM | POA: Diagnosis not present

## 2023-01-31 DIAGNOSIS — M545 Low back pain, unspecified: Secondary | ICD-10-CM | POA: Diagnosis not present

## 2023-02-01 ENCOUNTER — Telehealth: Payer: Self-pay

## 2023-02-01 NOTE — Progress Notes (Signed)
[place] [facility]) [place]   02/01/2023  Drayk Passey 09-Aug-1956 [id number]   Reason for referral: Medication Assistance with Farxiga  Referral source: Dr. Nyra Capes Current insurance: Holland Falling  Reason for call: Medication Assistance with [place]:  Unsuccessful telephone call attempt #3 to patient.   HIPAA compliant voicemail left requesting a return call  Plan:  -I will close [place] case at this time as I have been unable to establish and/or maintain contact with patient.   Reed Breech, PharmD Clinical Pharmacist  [place] [phone]

## 2023-02-05 DIAGNOSIS — R531 Weakness: Secondary | ICD-10-CM | POA: Diagnosis not present

## 2023-02-05 DIAGNOSIS — R296 Repeated falls: Secondary | ICD-10-CM | POA: Diagnosis not present

## 2023-02-05 DIAGNOSIS — M545 Low back pain, unspecified: Secondary | ICD-10-CM | POA: Diagnosis not present

## 2023-02-05 DIAGNOSIS — M2560 Stiffness of unspecified joint, not elsewhere classified: Secondary | ICD-10-CM | POA: Diagnosis not present

## 2023-02-07 DIAGNOSIS — R531 Weakness: Secondary | ICD-10-CM | POA: Diagnosis not present

## 2023-02-07 DIAGNOSIS — M2560 Stiffness of unspecified joint, not elsewhere classified: Secondary | ICD-10-CM | POA: Diagnosis not present

## 2023-02-07 DIAGNOSIS — M545 Low back pain, unspecified: Secondary | ICD-10-CM | POA: Diagnosis not present

## 2023-02-07 DIAGNOSIS — R296 Repeated falls: Secondary | ICD-10-CM | POA: Diagnosis not present

## 2023-02-12 DIAGNOSIS — R531 Weakness: Secondary | ICD-10-CM | POA: Diagnosis not present

## 2023-02-12 DIAGNOSIS — M2560 Stiffness of unspecified joint, not elsewhere classified: Secondary | ICD-10-CM | POA: Diagnosis not present

## 2023-02-12 DIAGNOSIS — R296 Repeated falls: Secondary | ICD-10-CM | POA: Diagnosis not present

## 2023-02-12 DIAGNOSIS — M545 Low back pain, unspecified: Secondary | ICD-10-CM | POA: Diagnosis not present

## 2023-02-14 DIAGNOSIS — M545 Low back pain, unspecified: Secondary | ICD-10-CM | POA: Diagnosis not present

## 2023-02-14 DIAGNOSIS — M2560 Stiffness of unspecified joint, not elsewhere classified: Secondary | ICD-10-CM | POA: Diagnosis not present

## 2023-02-14 DIAGNOSIS — R296 Repeated falls: Secondary | ICD-10-CM | POA: Diagnosis not present

## 2023-02-14 DIAGNOSIS — R531 Weakness: Secondary | ICD-10-CM | POA: Diagnosis not present

## 2023-02-16 DIAGNOSIS — E1165 Type 2 diabetes mellitus with hyperglycemia: Secondary | ICD-10-CM | POA: Diagnosis not present

## 2023-02-16 DIAGNOSIS — E1159 Type 2 diabetes mellitus with other circulatory complications: Secondary | ICD-10-CM | POA: Diagnosis not present

## 2023-02-16 DIAGNOSIS — E785 Hyperlipidemia, unspecified: Secondary | ICD-10-CM | POA: Diagnosis not present

## 2023-02-21 DIAGNOSIS — M545 Low back pain, unspecified: Secondary | ICD-10-CM | POA: Diagnosis not present

## 2023-02-21 DIAGNOSIS — M2560 Stiffness of unspecified joint, not elsewhere classified: Secondary | ICD-10-CM | POA: Diagnosis not present

## 2023-02-21 DIAGNOSIS — R296 Repeated falls: Secondary | ICD-10-CM | POA: Diagnosis not present

## 2023-02-21 DIAGNOSIS — R531 Weakness: Secondary | ICD-10-CM | POA: Diagnosis not present

## 2023-02-22 DIAGNOSIS — E785 Hyperlipidemia, unspecified: Secondary | ICD-10-CM | POA: Diagnosis not present

## 2023-02-22 DIAGNOSIS — Z6826 Body mass index (BMI) 26.0-26.9, adult: Secondary | ICD-10-CM | POA: Diagnosis not present

## 2023-02-22 DIAGNOSIS — F17200 Nicotine dependence, unspecified, uncomplicated: Secondary | ICD-10-CM | POA: Diagnosis not present

## 2023-02-22 DIAGNOSIS — E1159 Type 2 diabetes mellitus with other circulatory complications: Secondary | ICD-10-CM | POA: Diagnosis not present

## 2023-02-22 DIAGNOSIS — I152 Hypertension secondary to endocrine disorders: Secondary | ICD-10-CM | POA: Diagnosis not present

## 2023-02-26 ENCOUNTER — Telehealth: Payer: Self-pay | Admitting: Pharmacy Technician

## 2023-02-26 ENCOUNTER — Telehealth: Payer: Self-pay | Admitting: *Deleted

## 2023-02-26 DIAGNOSIS — Z5986 Financial insecurity: Secondary | ICD-10-CM

## 2023-02-26 NOTE — Progress Notes (Addendum)
[facility])  [facility] Quality Pharmacy Team   02/26/2023  Rocio Wolak [birth year] [id number]  Reason for referral: Medication assistance with [place] for Farxiga  Referral source: Dr. Charlott Rakes Current insurance: Monia Pouch  Outreach:  Successful telephone call with patient, Dillan Candela.  HIPAA identifiers verified.   Medication Review Findings:  Patient qualifies for medication assistance thru [place] for Farxiga Patient qualifies for medication assistance thru [employer] for [employer]   Medication Assistance Findings:  Medication assistance needs identified: Social research officer, government   Extra Help:  Not eligible for Extra Help Low Income Subsidy based on reported income and assets  Additional medication assistance options reviewed with patient as warranted:  No other options identified  Plan: I will route patient assistance letter to [facility] pharmacy technician who will coordinate patient assistance program application process for medications listed above.  [facility] pharmacy technician will assist with obtaining all required documents from both patient and provider(s) and submit application(s) once completed.  Thank you for allowing [facility] pharmacy to be a part of this patient's care.   Larue Drawdy P. Lou Irigoyen, CPhT [facility]  [phone]

## 2023-02-26 NOTE — Progress Notes (Signed)
  Care Coordination  Outreach Note  02/26/2023 Name: Andrew Russo MRN: [id number] DOB: 09-01-1956   Care Coordination Outreach Attempts: An unsuccessful telephone outreach was attempted today to offer the patient information about available care coordination services as a benefit of their health plan.   Follow Up Plan:  Additional outreach attempts will be made to offer the patient care coordination information and services.   Encounter Outcome:  No Answer  Burman Nieves, CCMA Care Coordination Care Guide Direct Dial: [phone]

## 2023-03-02 ENCOUNTER — Telehealth: Payer: Self-pay | Admitting: Pharmacy Technician

## 2023-03-02 DIAGNOSIS — Z5986 Financial insecurity: Secondary | ICD-10-CM

## 2023-03-02 NOTE — Progress Notes (Signed)
[facility])                                            [facility] Quality Pharmacy Team    03/02/2023  Andrew Russo 30-Sep-1956 [id number]                                      Medication Assistance Referral  Referral From: [name]   Medication/Company: [name] Patient application portion:  Mailed Provider application portion: Faxed  to Dr. Charlott Rakes Provider address/fax verified via: Office website  Medication/Company: Marcelline Deist / AZ&E Patient application portion:  Mailed Provider application portion: Faxed  to Dr. Celesta Aver Provider address/fax verified via: Office website  Prabhav Faulkenberry P. Tahjae Durr, CPhT [facility]  [phone]

## 2023-03-14 DIAGNOSIS — F17210 Nicotine dependence, cigarettes, uncomplicated: Secondary | ICD-10-CM | POA: Diagnosis not present

## 2023-03-14 DIAGNOSIS — J439 Emphysema, unspecified: Secondary | ICD-10-CM | POA: Diagnosis not present

## 2023-03-14 DIAGNOSIS — Z122 Encounter for screening for malignant neoplasm of respiratory organs: Secondary | ICD-10-CM | POA: Diagnosis not present

## 2023-03-14 DIAGNOSIS — I700 Atherosclerosis of aorta: Secondary | ICD-10-CM | POA: Diagnosis not present

## 2023-03-16 NOTE — Progress Notes (Signed)
  Care Coordination  Outreach Note  03/16/2023 Name: Andrew Russo MRN: [id number] DOB: 01-Jan-1956   Care Coordination Outreach Attempts: A third unsuccessful outreach was attempted today to offer the patient with information about available care coordination services.  Follow Up Plan:  No further outreach attempts will be made at this time. We have been unable to contact the patient to offer or enroll patient in care coordination services  Encounter Outcome:  No Answer  Burman Nieves, [facility] Care Coordination Care Guide Direct Dial: [phone]

## 2023-03-27 DIAGNOSIS — E1159 Type 2 diabetes mellitus with other circulatory complications: Secondary | ICD-10-CM | POA: Diagnosis not present

## 2023-03-31 DIAGNOSIS — E1159 Type 2 diabetes mellitus with other circulatory complications: Secondary | ICD-10-CM | POA: Diagnosis not present

## 2023-03-31 DIAGNOSIS — M069 Rheumatoid arthritis, unspecified: Secondary | ICD-10-CM | POA: Diagnosis not present

## 2023-04-25 DIAGNOSIS — E785 Hyperlipidemia, unspecified: Secondary | ICD-10-CM | POA: Diagnosis not present

## 2023-04-25 DIAGNOSIS — E1159 Type 2 diabetes mellitus with other circulatory complications: Secondary | ICD-10-CM | POA: Diagnosis not present

## 2023-04-26 DIAGNOSIS — E1159 Type 2 diabetes mellitus with other circulatory complications: Secondary | ICD-10-CM | POA: Diagnosis not present

## 2023-04-30 DIAGNOSIS — E1159 Type 2 diabetes mellitus with other circulatory complications: Secondary | ICD-10-CM | POA: Diagnosis not present

## 2023-04-30 DIAGNOSIS — M069 Rheumatoid arthritis, unspecified: Secondary | ICD-10-CM | POA: Diagnosis not present

## 2023-05-07 DIAGNOSIS — Z6826 Body mass index (BMI) 26.0-26.9, adult: Secondary | ICD-10-CM | POA: Diagnosis not present

## 2023-05-07 DIAGNOSIS — L209 Atopic dermatitis, unspecified: Secondary | ICD-10-CM | POA: Diagnosis not present

## 2023-05-10 DIAGNOSIS — E785 Hyperlipidemia, unspecified: Secondary | ICD-10-CM | POA: Diagnosis not present

## 2023-05-10 DIAGNOSIS — Z6825 Body mass index (BMI) 25.0-25.9, adult: Secondary | ICD-10-CM | POA: Diagnosis not present

## 2023-05-10 DIAGNOSIS — E1165 Type 2 diabetes mellitus with hyperglycemia: Secondary | ICD-10-CM | POA: Diagnosis not present

## 2023-05-10 DIAGNOSIS — L209 Atopic dermatitis, unspecified: Secondary | ICD-10-CM | POA: Diagnosis not present

## 2023-05-10 DIAGNOSIS — Z794 Long term (current) use of insulin: Secondary | ICD-10-CM | POA: Diagnosis not present

## 2023-05-26 DIAGNOSIS — E1159 Type 2 diabetes mellitus with other circulatory complications: Secondary | ICD-10-CM | POA: Diagnosis not present

## 2023-06-12 DIAGNOSIS — B351 Tinea unguium: Secondary | ICD-10-CM | POA: Diagnosis not present

## 2023-06-12 DIAGNOSIS — Z139 Encounter for screening, unspecified: Secondary | ICD-10-CM | POA: Diagnosis not present

## 2023-06-12 DIAGNOSIS — L84 Corns and callosities: Secondary | ICD-10-CM | POA: Diagnosis not present

## 2023-06-12 DIAGNOSIS — Z6826 Body mass index (BMI) 26.0-26.9, adult: Secondary | ICD-10-CM | POA: Diagnosis not present

## 2023-06-20 ENCOUNTER — Ambulatory Visit (INDEPENDENT_AMBULATORY_CARE_PROVIDER_SITE_OTHER): Payer: Medicare HMO

## 2023-06-20 ENCOUNTER — Ambulatory Visit: Payer: Medicare HMO | Admitting: Podiatry

## 2023-06-20 DIAGNOSIS — L02611 Cutaneous abscess of right foot: Secondary | ICD-10-CM | POA: Diagnosis not present

## 2023-06-20 DIAGNOSIS — R600 Localized edema: Secondary | ICD-10-CM | POA: Diagnosis not present

## 2023-06-20 DIAGNOSIS — S9001XA Contusion of right ankle, initial encounter: Secondary | ICD-10-CM | POA: Diagnosis not present

## 2023-06-20 DIAGNOSIS — E1140 Type 2 diabetes mellitus with diabetic neuropathy, unspecified: Secondary | ICD-10-CM | POA: Diagnosis not present

## 2023-06-20 DIAGNOSIS — L02619 Cutaneous abscess of unspecified foot: Secondary | ICD-10-CM | POA: Diagnosis not present

## 2023-06-20 DIAGNOSIS — L03119 Cellulitis of unspecified part of limb: Secondary | ICD-10-CM | POA: Diagnosis not present

## 2023-06-20 MED ORDER — AMOXICILLIN-POT CLAVULANATE 875-125 MG PO TABS: 1.0000 | ORAL_TABLET | Freq: Two times a day (BID) | ORAL | 0 refills | Status: DC

## 2023-06-20 NOTE — Progress Notes (Signed)
Chief Complaint  Patient presents with   Callouses    Right foot calluses, tender. Patient is diabetic.     HPI: 67 y.o. male presents today with a couple concerns.  He notes pain on the bottom of the right foot near the fifth toe.  Denies stepping on a foreign object.  He does note he has some numbness in the foot so he cannot be absolutely certain he did not step on anything.  He also notes that he rolled his ankle the other day and has been having some pain to the area.  This is the right ankle.  He is aware it is bruised and swollen.  He also notes his nails are very thick and long and discolored.  He would like some assistance with nail trimming.  Patient is diabetic  Past Medical History:  Diagnosis Date   Diabetes mellitus without complication (HCC)    Hypertension    MI (myocardial infarction) (HCC)    PE (pulmonary embolism)     Past Surgical History:  Procedure Laterality Date   BACK SURGERY     neck fusion     REPLACEMENT TOTAL KNEE     SHOULDER SURGERY     No Known Allergies   Physical Exam: There were no vitals filed for this visit.  General: The patient is alert and oriented x3 in no acute distress.  Dermatology: Skin is warm, dry and supple bilateral lower extremities. Interspaces are clear of maceration and debris.  There is a small superficial abscess submet 5 right foot.  Minimal localized erythema noted.  A #313 blade was utilized to incise the abscess which was then drained.  The exudate was swabbed and sent for Gram stain and culture.  Vascular: Palpable pedal pulses bilaterally. Capillary refill within normal limits.  Moderate edema and ecchymosis to the medial and lateral aspects of the right ankle   Neurological: Light touch sensation diminished bilateral feet.   Musculoskeletal Exam: Pain on palpation right submet 5.  Pain on palpation to the medial and lateral aspects of the right ankle.  Pain with ankle range of motion and subtalar range of  motion.  No crepitus noted.  No bony abnormalities on palpation of the tibia and fibular malleolus   Radiographic Exam (right ankle, 3 views, 06/20/2023):  Normal osseous mineralization.  No fracture seen around the ankle joint.  No foreign body seen near the left fifth metatarsal head.  Increase in soft tissue volume on the medial lateral aspects of the ankle.  Inferior calcaneal spur noted  Assessment/Plan of Care: 1. Cellulitis and abscess of foot, except toes   2. Contusion of right ankle, initial encounter   3. Localized edema   4. Type 2 diabetes mellitus with diabetic neuropathy, without long-term current use of insulin (HCC)      Meds ordered this encounter  Medications   amoxicillin-clavulanate (AUGMENTIN) 875-125 MG tablet    Sig: Take 1 tablet by mouth 2 (two) times daily.    Dispense:  20 tablet    Refill:  0   Discussed clinical findings with patient today.  The superficial abscess submet 5 right foot was incised with a sterile #313 blade and the exudate was removed and swabbed and sent for culture.  Will start the patient on Augmentin for antibiotic coverage until we receive the culture results.  Informed patient we may change or add an additional antibiotic based on the results.  Requested the patient stay off his foot is much  as possible until seen in the next 1 to 2 weeks for recheck.  Will have him apply iodine and a light dressing or Band-Aid to the area daily.  The x-rays were reviewed of the right ankle which were negative for fracture.  Patient should rest and ice and elevate the right ankle until the pain, swelling and bruising improved.  I will follow-up in 1 to 2 weeks.  Will also perform nail care at that appointment.  Clerance Lav, DPM, FACFAS [facility]     [address] [place], [place] [address]                Office [phone]  Fax [phone]

## 2023-06-25 DIAGNOSIS — Z139 Encounter for screening, unspecified: Secondary | ICD-10-CM | POA: Diagnosis not present

## 2023-06-25 DIAGNOSIS — M069 Rheumatoid arthritis, unspecified: Secondary | ICD-10-CM | POA: Diagnosis not present

## 2023-06-25 DIAGNOSIS — Z6826 Body mass index (BMI) 26.0-26.9, adult: Secondary | ICD-10-CM | POA: Diagnosis not present

## 2023-06-27 ENCOUNTER — Ambulatory Visit (INDEPENDENT_AMBULATORY_CARE_PROVIDER_SITE_OTHER): Payer: Medicare HMO | Admitting: Podiatry

## 2023-06-27 DIAGNOSIS — L02611 Cutaneous abscess of right foot: Secondary | ICD-10-CM

## 2023-06-27 DIAGNOSIS — M79674 Pain in right toe(s): Secondary | ICD-10-CM

## 2023-06-27 DIAGNOSIS — B351 Tinea unguium: Secondary | ICD-10-CM | POA: Diagnosis not present

## 2023-06-27 DIAGNOSIS — M79675 Pain in left toe(s): Secondary | ICD-10-CM | POA: Diagnosis not present

## 2023-06-27 NOTE — Progress Notes (Signed)
    Subjective:  Patient ID: Andrew Russo, male    DOB: 1956/08/26,  MRN: [id number]   Andrew Russo presents to clinic today for:  Chief Complaint  Patient presents with   Nail Problem    Diabetic Foot Care-nail trim    Follow-up    Abscess right foot. He is still taking the antibiotics.   . Patient notes nails are thick, discolored, elongated and painful in shoegear when trying to ambulate.  He is also here for follow-up of a submet 5 abscess right foot.  He notes that he is doing much better after his last visit and taking the antibiotics and the offloading pad that was placed in his shoe.  He has different insoles in his shoes today which do not have the offloading pad in place.  PCP is Charlott Rakes, MD.  Past Medical History:  Diagnosis Date   Diabetes mellitus without complication (HCC)    Hypertension    MI (myocardial infarction) (HCC)    PE (pulmonary embolism)     No Known Allergies  Review of Systems: Negative except as noted in the HPI.  Objective:  There were no vitals filed for this visit.  Andrew Russo is a pleasant 67 y.o. male in NAD. AAO x 3.  Vascular Examination: Capillary refill time is 3-5 seconds to toes bilateral. Palpable pedal pulses b/l LE. Digital hair present b/l.  Skin temperature gradient WNL b/l. No varicosities b/l. No cyanosis noted b/l.   Dermatological Examination: Pedal skin with normal turgor, texture and tone b/l. No open wounds. No interdigital macerations b/l. Toenails x10 are 5-7 mm thick, discolored, dystrophic with subungual debris. There is pain with compression of the nail plates.  They are elongated x10  Assessment/Plan: 1. Pain due to onychomycosis of toenails of both feet   2. Abscess of right foot    A sterile #313 blade was utilized to remove some of the peeling dry/hard skin at the site of the previous abscess.  This appears stable today with no signs of infection.  Patient can continue applying Betadine  solution to the area followed by a light wrapping for a few additional days but that should be resolved by the beginning of next week and he can discontinue local wound care.  The offloading pad in his shoe was replaced today to continue keeping pressure off the area.  The mycotic toenails were sharply debrided x10 with sterile nail nippers and a power debriding burr to decrease bulk/thickness and length.    Return in about 3 months (around 09/27/2023) for [facility]   Clerance Lav, DPM, FACFAS [facility]     [address] [place], [place] [address]                Office [phone]  Fax [phone]

## 2023-07-23 DIAGNOSIS — R5383 Other fatigue: Secondary | ICD-10-CM | POA: Diagnosis not present

## 2023-07-23 DIAGNOSIS — M7989 Other specified soft tissue disorders: Secondary | ICD-10-CM | POA: Diagnosis not present

## 2023-07-23 DIAGNOSIS — E663 Overweight: Secondary | ICD-10-CM | POA: Diagnosis not present

## 2023-07-23 DIAGNOSIS — M1991 Primary osteoarthritis, unspecified site: Secondary | ICD-10-CM | POA: Diagnosis not present

## 2023-07-23 DIAGNOSIS — M0609 Rheumatoid arthritis without rheumatoid factor, multiple sites: Secondary | ICD-10-CM | POA: Diagnosis not present

## 2023-07-23 DIAGNOSIS — M5136 Other intervertebral disc degeneration, lumbar region: Secondary | ICD-10-CM | POA: Diagnosis not present

## 2023-07-23 DIAGNOSIS — Z111 Encounter for screening for respiratory tuberculosis: Secondary | ICD-10-CM | POA: Diagnosis not present

## 2023-07-23 DIAGNOSIS — Z6827 Body mass index (BMI) 27.0-27.9, adult: Secondary | ICD-10-CM | POA: Diagnosis not present

## 2023-07-23 DIAGNOSIS — M5030 Other cervical disc degeneration, unspecified cervical region: Secondary | ICD-10-CM | POA: Diagnosis not present

## 2023-08-06 DIAGNOSIS — M0609 Rheumatoid arthritis without rheumatoid factor, multiple sites: Secondary | ICD-10-CM | POA: Diagnosis not present

## 2023-08-06 DIAGNOSIS — M5136 Other intervertebral disc degeneration, lumbar region with discogenic back pain only: Secondary | ICD-10-CM | POA: Diagnosis not present

## 2023-08-06 DIAGNOSIS — Z6826 Body mass index (BMI) 26.0-26.9, adult: Secondary | ICD-10-CM | POA: Diagnosis not present

## 2023-08-06 DIAGNOSIS — E663 Overweight: Secondary | ICD-10-CM | POA: Diagnosis not present

## 2023-08-06 DIAGNOSIS — M1991 Primary osteoarthritis, unspecified site: Secondary | ICD-10-CM | POA: Diagnosis not present

## 2023-08-06 DIAGNOSIS — M5030 Other cervical disc degeneration, unspecified cervical region: Secondary | ICD-10-CM | POA: Diagnosis not present

## 2023-08-08 DIAGNOSIS — E1165 Type 2 diabetes mellitus with hyperglycemia: Secondary | ICD-10-CM | POA: Diagnosis not present

## 2023-08-08 DIAGNOSIS — E785 Hyperlipidemia, unspecified: Secondary | ICD-10-CM | POA: Diagnosis not present

## 2023-08-13 DIAGNOSIS — Z23 Encounter for immunization: Secondary | ICD-10-CM | POA: Diagnosis not present

## 2023-08-23 DIAGNOSIS — Z6826 Body mass index (BMI) 26.0-26.9, adult: Secondary | ICD-10-CM | POA: Diagnosis not present

## 2023-08-23 DIAGNOSIS — S91332A Puncture wound without foreign body, left foot, initial encounter: Secondary | ICD-10-CM | POA: Diagnosis not present

## 2023-08-29 DIAGNOSIS — E1140 Type 2 diabetes mellitus with diabetic neuropathy, unspecified: Secondary | ICD-10-CM | POA: Diagnosis not present

## 2023-08-29 DIAGNOSIS — Z6826 Body mass index (BMI) 26.0-26.9, adult: Secondary | ICD-10-CM | POA: Diagnosis not present

## 2023-09-11 DIAGNOSIS — E1140 Type 2 diabetes mellitus with diabetic neuropathy, unspecified: Secondary | ICD-10-CM | POA: Diagnosis not present

## 2023-09-13 DIAGNOSIS — E785 Hyperlipidemia, unspecified: Secondary | ICD-10-CM | POA: Diagnosis not present

## 2023-09-13 DIAGNOSIS — E1159 Type 2 diabetes mellitus with other circulatory complications: Secondary | ICD-10-CM | POA: Diagnosis not present

## 2023-09-13 DIAGNOSIS — E1165 Type 2 diabetes mellitus with hyperglycemia: Secondary | ICD-10-CM | POA: Diagnosis not present

## 2023-09-13 DIAGNOSIS — I2510 Atherosclerotic heart disease of native coronary artery without angina pectoris: Secondary | ICD-10-CM | POA: Diagnosis not present

## 2023-09-24 DIAGNOSIS — I2510 Atherosclerotic heart disease of native coronary artery without angina pectoris: Secondary | ICD-10-CM | POA: Diagnosis not present

## 2023-09-24 DIAGNOSIS — Z6827 Body mass index (BMI) 27.0-27.9, adult: Secondary | ICD-10-CM | POA: Diagnosis not present

## 2023-09-24 DIAGNOSIS — R6889 Other general symptoms and signs: Secondary | ICD-10-CM | POA: Diagnosis not present

## 2023-10-03 ENCOUNTER — Encounter: Payer: Medicare HMO | Admitting: Podiatry

## 2023-10-04 NOTE — Progress Notes (Signed)
Patient was a no-show for his scheduled appointment on 10/03/2023

## 2023-10-09 DIAGNOSIS — M5442 Lumbago with sciatica, left side: Secondary | ICD-10-CM | POA: Diagnosis not present

## 2023-10-09 DIAGNOSIS — I739 Peripheral vascular disease, unspecified: Secondary | ICD-10-CM | POA: Diagnosis not present

## 2023-10-09 DIAGNOSIS — Z720 Tobacco use: Secondary | ICD-10-CM | POA: Diagnosis not present

## 2023-10-09 DIAGNOSIS — F17210 Nicotine dependence, cigarettes, uncomplicated: Secondary | ICD-10-CM | POA: Diagnosis not present

## 2023-10-09 DIAGNOSIS — E785 Hyperlipidemia, unspecified: Secondary | ICD-10-CM | POA: Diagnosis not present

## 2023-10-09 DIAGNOSIS — G8929 Other chronic pain: Secondary | ICD-10-CM | POA: Diagnosis not present

## 2023-10-09 DIAGNOSIS — E119 Type 2 diabetes mellitus without complications: Secondary | ICD-10-CM | POA: Diagnosis not present

## 2023-10-09 DIAGNOSIS — M5441 Lumbago with sciatica, right side: Secondary | ICD-10-CM | POA: Diagnosis not present

## 2023-10-09 DIAGNOSIS — M79673 Pain in unspecified foot: Secondary | ICD-10-CM | POA: Diagnosis not present

## 2023-10-10 DIAGNOSIS — E1165 Type 2 diabetes mellitus with hyperglycemia: Secondary | ICD-10-CM | POA: Diagnosis not present

## 2023-10-16 DIAGNOSIS — Z9889 Other specified postprocedural states: Secondary | ICD-10-CM | POA: Diagnosis not present

## 2023-10-16 DIAGNOSIS — G894 Chronic pain syndrome: Secondary | ICD-10-CM | POA: Diagnosis not present

## 2023-10-16 DIAGNOSIS — G629 Polyneuropathy, unspecified: Secondary | ICD-10-CM | POA: Diagnosis not present

## 2023-10-17 DIAGNOSIS — G629 Polyneuropathy, unspecified: Secondary | ICD-10-CM | POA: Diagnosis not present

## 2023-10-17 DIAGNOSIS — Z6826 Body mass index (BMI) 26.0-26.9, adult: Secondary | ICD-10-CM | POA: Diagnosis not present

## 2023-10-17 DIAGNOSIS — M545 Low back pain, unspecified: Secondary | ICD-10-CM | POA: Diagnosis not present

## 2023-10-17 DIAGNOSIS — Z7689 Persons encountering health services in other specified circumstances: Secondary | ICD-10-CM | POA: Diagnosis not present

## 2023-10-17 DIAGNOSIS — M129 Arthropathy, unspecified: Secondary | ICD-10-CM | POA: Diagnosis not present

## 2023-10-17 DIAGNOSIS — Z794 Long term (current) use of insulin: Secondary | ICD-10-CM | POA: Diagnosis not present

## 2023-10-17 DIAGNOSIS — E1165 Type 2 diabetes mellitus with hyperglycemia: Secondary | ICD-10-CM | POA: Diagnosis not present

## 2023-10-17 DIAGNOSIS — M069 Rheumatoid arthritis, unspecified: Secondary | ICD-10-CM | POA: Diagnosis not present

## 2023-10-17 DIAGNOSIS — S91332A Puncture wound without foreign body, left foot, initial encounter: Secondary | ICD-10-CM | POA: Diagnosis not present

## 2023-10-17 DIAGNOSIS — Z6827 Body mass index (BMI) 27.0-27.9, adult: Secondary | ICD-10-CM | POA: Diagnosis not present

## 2023-10-17 DIAGNOSIS — S91332S Puncture wound without foreign body, left foot, sequela: Secondary | ICD-10-CM | POA: Diagnosis not present

## 2023-10-17 DIAGNOSIS — M542 Cervicalgia: Secondary | ICD-10-CM | POA: Diagnosis not present

## 2023-10-17 DIAGNOSIS — X58XXXS Exposure to other specified factors, sequela: Secondary | ICD-10-CM | POA: Diagnosis not present

## 2023-10-17 DIAGNOSIS — Z79899 Other long term (current) drug therapy: Secondary | ICD-10-CM | POA: Diagnosis not present

## 2023-10-17 DIAGNOSIS — M51362 Other intervertebral disc degeneration, lumbar region with discogenic back pain and lower extremity pain: Secondary | ICD-10-CM | POA: Diagnosis not present

## 2023-10-18 DIAGNOSIS — E785 Hyperlipidemia, unspecified: Secondary | ICD-10-CM | POA: Diagnosis not present

## 2023-10-18 DIAGNOSIS — E1165 Type 2 diabetes mellitus with hyperglycemia: Secondary | ICD-10-CM | POA: Diagnosis not present

## 2023-10-30 DIAGNOSIS — I152 Hypertension secondary to endocrine disorders: Secondary | ICD-10-CM | POA: Diagnosis not present

## 2023-10-30 DIAGNOSIS — E1159 Type 2 diabetes mellitus with other circulatory complications: Secondary | ICD-10-CM | POA: Diagnosis not present

## 2023-10-30 DIAGNOSIS — Z6827 Body mass index (BMI) 27.0-27.9, adult: Secondary | ICD-10-CM | POA: Diagnosis not present

## 2023-10-30 DIAGNOSIS — E785 Hyperlipidemia, unspecified: Secondary | ICD-10-CM | POA: Diagnosis not present

## 2023-10-30 DIAGNOSIS — Z794 Long term (current) use of insulin: Secondary | ICD-10-CM | POA: Diagnosis not present

## 2023-10-30 DIAGNOSIS — E1165 Type 2 diabetes mellitus with hyperglycemia: Secondary | ICD-10-CM | POA: Diagnosis not present

## 2023-11-12 DIAGNOSIS — E1140 Type 2 diabetes mellitus with diabetic neuropathy, unspecified: Secondary | ICD-10-CM | POA: Diagnosis not present

## 2023-11-12 DIAGNOSIS — Z6826 Body mass index (BMI) 26.0-26.9, adult: Secondary | ICD-10-CM | POA: Diagnosis not present

## 2023-11-12 DIAGNOSIS — F17200 Nicotine dependence, unspecified, uncomplicated: Secondary | ICD-10-CM | POA: Diagnosis not present

## 2023-11-12 DIAGNOSIS — S91002A Unspecified open wound, left ankle, initial encounter: Secondary | ICD-10-CM | POA: Diagnosis not present

## 2023-11-15 DIAGNOSIS — S91002D Unspecified open wound, left ankle, subsequent encounter: Secondary | ICD-10-CM | POA: Diagnosis not present

## 2023-11-15 DIAGNOSIS — Z6826 Body mass index (BMI) 26.0-26.9, adult: Secondary | ICD-10-CM | POA: Diagnosis not present

## 2023-11-29 DIAGNOSIS — Z23 Encounter for immunization: Secondary | ICD-10-CM | POA: Diagnosis not present

## 2023-11-29 DIAGNOSIS — Z125 Encounter for screening for malignant neoplasm of prostate: Secondary | ICD-10-CM | POA: Diagnosis not present

## 2023-11-29 DIAGNOSIS — Z0000 Encounter for general adult medical examination without abnormal findings: Secondary | ICD-10-CM | POA: Diagnosis not present

## 2023-11-29 DIAGNOSIS — Z6826 Body mass index (BMI) 26.0-26.9, adult: Secondary | ICD-10-CM | POA: Diagnosis not present

## 2023-12-27 ENCOUNTER — Ambulatory Visit: Payer: Medicare HMO | Admitting: Podiatry

## 2023-12-27 DIAGNOSIS — E11622 Type 2 diabetes mellitus with other skin ulcer: Secondary | ICD-10-CM | POA: Diagnosis not present

## 2023-12-27 DIAGNOSIS — L97322 Non-pressure chronic ulcer of left ankle with fat layer exposed: Secondary | ICD-10-CM

## 2023-12-27 DIAGNOSIS — L97309 Non-pressure chronic ulcer of unspecified ankle with unspecified severity: Secondary | ICD-10-CM

## 2023-12-27 MED ORDER — GENTAMICIN SULFATE 0.1 % EX OINT: 1.0000 | TOPICAL_OINTMENT | Freq: Every day | CUTANEOUS | 0 refills | Status: DC

## 2023-12-27 NOTE — Progress Notes (Signed)
 Chief Complaint  Patient presents with   Wound Check    Patient is here today with a small open wound on the  top of the left ankle. PCP has been caring for it for 5 months, it has gotten smaller but is still red areound it and when he moves his toes a certain way it flares with pain. Last A1c 8.9, eight weeks ago with a new apt in 2 weeks. No anti coag.    HPI: 68 y.o. male presenting today with concern of a wound on the front of the left ankle.  States that he had vascular studies performed at [facility] approximately 2 months ago.  He does not know the results of this test.  Patient is diabetic and notes his last A1c was 8.9.  Past Medical History:  Diagnosis Date   Diabetes mellitus without complication (HCC)    Hypertension    MI (myocardial infarction) (HCC)    PE (pulmonary embolism)    No Known Allergies  Smoker?: smoker  (1/2 ppd)   PHYSICAL EXAM  General: The patient is alert and oriented x3 in no acute distress.  Dermatology: Skin is warm, dry and supple bilateral lower extremities. Interspaces are clear of maceration and debris.  There is an eczematous type of skin rash surrounding the ulceration on the anterior left ankle.  This eczematous breakout is approximately 3 cm in diameter.  This involves thickened, lichenification of skin with dry exterior.    Wound 1:  Location: Anterior left ankle        Depth: Full-thickness        Wound Border: Minimal eschar border        Wound Base: Fibrogranular        Drainage: Scant serous       Odor?:  None        Surrounding Tissue: Eczematous        Infected?:  No        Necrosis?:  No        Pain?:  Minimal        Tunneling: No       Dimensions (cm): 0.4 x 0.5 x 0.2 cm  Vascular: Pedal pulses are nonpalpable  Neurological: Light touch sensation diminished  ASSESSMENT / PLAN OF CARE: 1. Skin ulcer of left ankle with fat layer exposed (HCC)   2. 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 Apply to wound daily & cover with gauze dressing.    Dispense:  30 g    Refill:  0   The ulceration was sharply debrided of hyperkeratotic and devitalized soft tissue with sterile #312 blade to the level of subcutaneous tissue.  Hemostasis obtained.  Promogran collagen, antibiotic ointment and DSD applied.  Reviewed off-loading with patient.  Reviewed daily dressing changes with patient.  Prescription for gentamicin ointment was sent to his pharmacy to begin applying to the area.  He will use the remaining portions of the collagen and was instructed to cut a small circular piece out to place on the wound, followed by the gentamicin ointment, followed by a gauze dressing.  Discussed risks / concerns regarding ulcer with patient and possible sequelae if left untreated.  Stressed importance of infection prevention at home. Short-term goals are: prevent infection, off-load ulcer, heal ulcer Long-term goals are:  prevent recurrence, prevent amputation.   Follow-up in 2 to 3 weeks for ulcer recheck   Tajae Maiolo DBurna Mortimer, DPM, FACFAS [facility]     [address] [place], [place] [address]                Office [phone]  Fax [phone]

## 2024-01-10 ENCOUNTER — Ambulatory Visit: Payer: Medicare HMO | Admitting: Podiatry

## 2024-01-17 ENCOUNTER — Ambulatory Visit: Admitting: Podiatry

## 2024-01-23 DIAGNOSIS — E1165 Type 2 diabetes mellitus with hyperglycemia: Secondary | ICD-10-CM | POA: Diagnosis not present

## 2024-01-23 DIAGNOSIS — E785 Hyperlipidemia, unspecified: Secondary | ICD-10-CM | POA: Diagnosis not present

## 2024-01-24 LAB — LAB REPORT - SCANNED
A1c: 11.3
EGFR: 89

## 2024-01-30 ENCOUNTER — Ambulatory Visit: Admitting: Podiatry

## 2024-01-30 DIAGNOSIS — L84 Corns and callosities: Secondary | ICD-10-CM | POA: Diagnosis not present

## 2024-01-30 DIAGNOSIS — L97322 Non-pressure chronic ulcer of left ankle with fat layer exposed: Secondary | ICD-10-CM

## 2024-01-30 NOTE — Progress Notes (Signed)
   Chief Complaint  Patient presents with   Wound Check    Left skin ulcer is healing well, the corn with the callous on the bottom really sore and causing pain when walking. He goes tomorrow for A1c draw, but BS has been running high.  He does not take anti coag.    HPI: 68 y.o. male presents today for follow-up of ulcer to the anterior left ankle.  Patient states the area has healed and he has already discontinued the daily care regimen to the area.  He also notes a painful skin lesion to the bottom of the left foot underneath the third toe, where he stepped on a nail approximately 4 months ago.  Patient states that the area had healed within 3 weeks of the injury.  Patient is diabetic  Past Medical History:  Diagnosis Date   Diabetes mellitus without complication (HCC)    Hypertension    MI (myocardial infarction) (HCC)    PE (pulmonary embolism)     Past Surgical History:  Procedure Laterality Date   BACK SURGERY     neck fusion     REPLACEMENT TOTAL KNEE     SHOULDER SURGERY     No Known Allergies   Physical Exam: The ulceration on the left hand anterior ankle is closed.  There is some peeling of skin to the area.  No edema or erythema is noted.  There is no pain on palpation of the area.  There is a hyperkeratotic lesion on the plantar aspect of the left foot at the head of the third metatarsal.  There is pain on palpation of the lesion.  No surrounding erythema or drainage is noted.  Assessment/Plan of Care: 1. Skin ulcer of left ankle with fat layer exposed (HCC)   2. Callus of foot    Discussed clinical findings with patient today.  Patient may use gentamicin 2-3 times a week on the anterior ankle until the peeling and dry skin have resolved.  The submet 3 skin lesion was shaved with sterile #313 blade.  No evidence of foreign body was noted upon shaving of the lesion.  Salinocaine and a Band-Aid were applied and he can remove this at bedtime.  Follow-up as  needed   Clerance Lav, DPM, FACFAS [facility]     [address] [place], [place] [address]                Office [phone]  Fax [phone]

## 2024-01-31 DIAGNOSIS — E1159 Type 2 diabetes mellitus with other circulatory complications: Secondary | ICD-10-CM | POA: Diagnosis not present

## 2024-01-31 DIAGNOSIS — E785 Hyperlipidemia, unspecified: Secondary | ICD-10-CM | POA: Diagnosis not present

## 2024-01-31 DIAGNOSIS — Z794 Long term (current) use of insulin: Secondary | ICD-10-CM | POA: Diagnosis not present

## 2024-01-31 DIAGNOSIS — E1165 Type 2 diabetes mellitus with hyperglycemia: Secondary | ICD-10-CM | POA: Diagnosis not present

## 2024-01-31 DIAGNOSIS — Z6826 Body mass index (BMI) 26.0-26.9, adult: Secondary | ICD-10-CM | POA: Diagnosis not present

## 2024-02-29 DIAGNOSIS — M545 Low back pain, unspecified: Secondary | ICD-10-CM | POA: Insufficient documentation

## 2024-02-29 DIAGNOSIS — G8929 Other chronic pain: Secondary | ICD-10-CM

## 2024-02-29 DIAGNOSIS — M542 Cervicalgia: Secondary | ICD-10-CM | POA: Insufficient documentation

## 2024-02-29 HISTORY — DX: Other chronic pain: G89.29

## 2024-02-29 HISTORY — DX: Cervicalgia: M54.2

## 2024-03-20 DIAGNOSIS — M5412 Radiculopathy, cervical region: Secondary | ICD-10-CM | POA: Diagnosis not present

## 2024-03-21 DIAGNOSIS — G542 Cervical root disorders, not elsewhere classified: Secondary | ICD-10-CM

## 2024-03-21 HISTORY — DX: Cervical root disorders, not elsewhere classified: G54.2

## 2024-04-02 DIAGNOSIS — M5412 Radiculopathy, cervical region: Secondary | ICD-10-CM | POA: Diagnosis not present

## 2024-05-19 DIAGNOSIS — E1165 Type 2 diabetes mellitus with hyperglycemia: Secondary | ICD-10-CM | POA: Diagnosis not present

## 2024-05-19 DIAGNOSIS — E785 Hyperlipidemia, unspecified: Secondary | ICD-10-CM | POA: Diagnosis not present

## 2024-05-20 LAB — LAB REPORT - SCANNED
A1c: 9.6
EGFR: 74

## 2024-05-26 DIAGNOSIS — E1159 Type 2 diabetes mellitus with other circulatory complications: Secondary | ICD-10-CM | POA: Diagnosis not present

## 2024-05-26 DIAGNOSIS — E785 Hyperlipidemia, unspecified: Secondary | ICD-10-CM | POA: Diagnosis not present

## 2024-05-26 DIAGNOSIS — E1165 Type 2 diabetes mellitus with hyperglycemia: Secondary | ICD-10-CM | POA: Diagnosis not present

## 2024-05-26 DIAGNOSIS — Z794 Long term (current) use of insulin: Secondary | ICD-10-CM | POA: Diagnosis not present

## 2024-05-26 DIAGNOSIS — I152 Hypertension secondary to endocrine disorders: Secondary | ICD-10-CM | POA: Diagnosis not present

## 2024-05-26 DIAGNOSIS — Z6826 Body mass index (BMI) 26.0-26.9, adult: Secondary | ICD-10-CM | POA: Diagnosis not present

## 2024-05-27 DIAGNOSIS — E875 Hyperkalemia: Secondary | ICD-10-CM | POA: Diagnosis not present

## 2024-05-27 DIAGNOSIS — M069 Rheumatoid arthritis, unspecified: Secondary | ICD-10-CM | POA: Diagnosis not present

## 2024-05-29 LAB — LAB REPORT - SCANNED: EGFR: 74

## 2024-06-03 DIAGNOSIS — M79641 Pain in right hand: Secondary | ICD-10-CM | POA: Diagnosis not present

## 2024-06-04 DIAGNOSIS — M79641 Pain in right hand: Secondary | ICD-10-CM | POA: Diagnosis not present

## 2024-06-04 DIAGNOSIS — M19041 Primary osteoarthritis, right hand: Secondary | ICD-10-CM | POA: Diagnosis not present

## 2024-06-04 DIAGNOSIS — M19042 Primary osteoarthritis, left hand: Secondary | ICD-10-CM | POA: Diagnosis not present

## 2024-07-02 DIAGNOSIS — M542 Cervicalgia: Secondary | ICD-10-CM | POA: Diagnosis not present

## 2024-07-02 DIAGNOSIS — M545 Low back pain, unspecified: Secondary | ICD-10-CM | POA: Diagnosis not present

## 2024-07-17 ENCOUNTER — Ambulatory Visit (HOSPITAL_BASED_OUTPATIENT_CLINIC_OR_DEPARTMENT_OTHER): Admission: EM | Admit: 2024-07-17 | Discharge: 2024-07-17 | Disposition: A | Discharge status: Home / Self Care

## 2024-07-17 ENCOUNTER — Other Ambulatory Visit (HOSPITAL_BASED_OUTPATIENT_CLINIC_OR_DEPARTMENT_OTHER): Payer: Self-pay

## 2024-07-17 ENCOUNTER — Encounter (HOSPITAL_BASED_OUTPATIENT_CLINIC_OR_DEPARTMENT_OTHER): Payer: Self-pay

## 2024-07-17 DIAGNOSIS — H6502 Acute serous otitis media, left ear: Secondary | ICD-10-CM

## 2024-07-17 MED ORDER — AMOXICILLIN-POT CLAVULANATE 875-125 MG PO TABS
1.0000 | ORAL_TABLET | Freq: Two times a day (BID) | ORAL | 0 refills | Status: DC
  Filled 2024-07-17: qty 14, 7d supply, fill #0

## 2024-07-17 MED ORDER — AMOXICILLIN 875 MG PO TABS
875.0000 mg | ORAL_TABLET | Freq: Two times a day (BID) | ORAL | 0 refills | Status: AC
  Filled 2024-07-17: qty 14, 7d supply, fill #0

## 2024-07-17 NOTE — ED Triage Notes (Signed)
 Pt c/o left ear pain that started Monday. Pain is also located at the back of his ear. Pain is described as sharp and when he moves his jaw he can hear fluid in his ear. He has tried ear drops with no relief.

## 2024-07-17 NOTE — Discharge Instructions (Signed)
 Treating you for an ear infection.  Take the antibiotics as prescribed.  You can do 600 to 800 mg of ibuprofen  every 8 hours along with 2 extra strength Tylenol  every 8 hours as needed.  Warm compresses to the outside of the ear.  Follow-up as needed

## 2024-07-21 NOTE — ED Provider Notes (Addendum)
 [facility]    CSN: [id number] Arrival date & time: 07/17/24  1415      History   Chief Complaint Chief Complaint  Patient presents with   Otalgia    HPI Andrew Russo is a 68 y.o. male.   Pt c/o left ear pain that started Monday. Pain is also located at the back of his ear. Pain is described as sharp and when he moves his jaw he can hear fluid in his ear. He has tried ear drops with no relief. No fever.     Otalgia   Past Medical History:  Diagnosis Date   Diabetes mellitus without complication (HCC)    Hypertension    MI (myocardial infarction) (HCC)    PE (pulmonary embolism)     Patient Active Problem List   Diagnosis Date Noted   Essential hypertension 02/18/2017   HLD (hyperlipidemia) 02/18/2017   Hyponatremia 02/18/2017   Type 2 diabetes mellitus with peripheral neuropathy (HCC) 02/18/2017    Past Surgical History:  Procedure Laterality Date   BACK SURGERY     neck fusion     REPLACEMENT TOTAL KNEE     SHOULDER SURGERY         Home Medications    Prior to Admission medications   Medication Sig Start Date End Date Taking? Authorizing Provider  amoxicillin  (AMOXIL ) 875 MG tablet Take 1 tablet (875 mg total) by mouth 2 (two) times daily for 7 days. 07/17/24 07/24/24 Yes Arvella Massingale A, FNP  acetaminophen  (TYLENOL ) 500 MG tablet Take 1,500 mg by mouth every 6 (six) hours as needed for mild pain.    [provider]  atorvastatin (LIPITOR) 80 MG tablet Take 80 mg by mouth daily.    [provider]  busPIRone (BUSPAR) 10 MG tablet Take 10 mg by mouth 3 (three) times daily.    [provider]  FARXIGA 10 MG TABS tablet Take 10 mg by mouth daily.    [provider]  furosemide (LASIX) 40 MG tablet Take 40 mg by mouth daily.    [provider]  gabapentin  (NEURONTIN ) 100 MG capsule TAKE 1 CAPSULE BY MOUTH THREE TIMES DAILY AS NEEDED    [provider]  Insulin  Glargine (BASAGLAR KWIKPEN) 100  UNIT/ML INJECT 30 UNITS SUBCUTANEOUSLY ONCE DAILY Subcutaneous for 50 Days 08/06/23   [provider]  labetalol (NORMODYNE) 100 MG tablet Take 100 mg by mouth 2 (two) times daily.    [provider]  losartan (COZAAR) 100 MG tablet Take 100 mg by mouth daily.    [provider]  metFORMIN (GLUCOPHAGE) 500 MG tablet Take 1,000 mg by mouth 2 (two) times daily with a meal.    [provider]  venlafaxine (EFFEXOR) 50 MG tablet Take 100 mg by mouth 2 (two) times daily.    [provider]    Family History History reviewed. No pertinent family history.  Social History Social History   Tobacco Use   Smoking status: Every Day    Current packs/day: 0.50    Types: Cigarettes   Smokeless tobacco: Never  Vaping Use   Vaping status: Never Used  Substance Use Topics   Alcohol  use: No     Allergies   Patient has no known allergies.   Review of Systems Review of Systems  HENT:  Positive for ear pain.      Physical Exam Triage Vital Signs ED Triage Vitals  Encounter Vitals Group     BP 07/17/24 1429 113/73  Girls Systolic BP Percentile --      Girls Diastolic BP Percentile --      Boys Systolic BP Percentile --      Boys Diastolic BP Percentile --      Pulse Rate 07/17/24 1429 72     Resp 07/17/24 1429 20     Temp 07/17/24 1429 98.5 F (36.9 C)     Temp Source 07/17/24 1429 Oral     SpO2 07/17/24 1429 95 %     Weight --      Height --      Head Circumference --      Peak Flow --      Pain Score 07/17/24 1425 8     Pain Loc --      Pain Education --      Exclude from Growth Chart --    No data found.  Updated Vital Signs BP 113/73 (BP Location: Right Arm)   Pulse 72   Temp 98.5 F (36.9 C) (Oral)   Resp 20   SpO2 95%   Visual Acuity Right Eye Distance:   Left Eye Distance:   Bilateral Distance:    Right Eye Near:   Left Eye Near:    Bilateral Near:     Physical Exam Vitals and nursing note reviewed.   Constitutional:      General: He is not in acute distress.    Appearance: Normal appearance. He is not ill-appearing, toxic-appearing or diaphoretic.  HENT:     Right Ear: A middle ear effusion is present. Tympanic membrane is erythematous and bulging.     Left Ear: A middle ear effusion is present. Tympanic membrane is erythematous and bulging.     Ears:     Comments: Serous fluids to TM bilateral  Neurological:     Mental Status: He is alert.      UC Treatments / Results  Labs (all labs ordered are listed, but only abnormal results are displayed) Labs Reviewed - No data to display  EKG   Radiology No results found.  Procedures Procedures (including critical care time)  Medications Ordered in UC Medications - No data to display  Initial Impression / Assessment and Plan / UC Course  I have reviewed the triage vital signs and the nursing notes.  Pertinent labs & imaging results that were available during my care of the patient were reviewed by me and considered in my medical decision making (see chart for details).     Acute serous otitis media of left ear-treating for ear infection with amoxicillin .  Medication as prescribed.  Recommend ibuprofen  every 8 hours along with extra Tylenol  as needed.  Warm compresses to the ear.  Follow-up as needed Final Clinical Impressions(s) / UC Diagnoses   Final diagnoses:  Non-recurrent acute serous otitis media of left ear     Discharge Instructions      Treating you for an ear infection.  Take the antibiotics as prescribed.  You can do 600 to 800 mg of ibuprofen  every 8 hours along with 2 extra strength Tylenol  every 8 hours as needed.  Warm compresses to the outside of the ear.  Follow-up as needed    ED Prescriptions     Medication Sig Dispense Auth. Provider   amoxicillin -clavulanate (AUGMENTIN ) 875-125 MG tablet  (Status: Discontinued) Take 1 tablet by mouth every 12 (twelve) hours. 14 tablet Daysie Helf A, FNP    amoxicillin  (AMOXIL ) 875 MG tablet Take 1 tablet (875 mg total) by mouth  2 (two) times daily for 7 days. 14 tablet Adah Wilbert LABOR, FNP      PDMP not reviewed this encounter.   Adah Wilbert LABOR, FNP 07/21/24 0851    Adah Wilbert LABOR, FNP 07/21/24 [phone]

## 2024-08-04 ENCOUNTER — Ambulatory Visit (INDEPENDENT_AMBULATORY_CARE_PROVIDER_SITE_OTHER)
Admission: RE | Admit: 2024-08-04 | Discharge: 2024-08-04 | Disposition: A | Discharge status: Home / Self Care | Source: Ambulatory Visit | Attending: Family Medicine | Admitting: Family Medicine

## 2024-08-04 ENCOUNTER — Other Ambulatory Visit (HOSPITAL_BASED_OUTPATIENT_CLINIC_OR_DEPARTMENT_OTHER): Payer: Self-pay | Admitting: Family Medicine

## 2024-08-04 DIAGNOSIS — Z981 Arthrodesis status: Secondary | ICD-10-CM | POA: Diagnosis not present

## 2024-08-04 DIAGNOSIS — M25561 Pain in right knee: Secondary | ICD-10-CM | POA: Diagnosis not present

## 2024-08-04 DIAGNOSIS — M160 Bilateral primary osteoarthritis of hip: Secondary | ICD-10-CM | POA: Diagnosis not present

## 2024-08-04 DIAGNOSIS — M25551 Pain in right hip: Secondary | ICD-10-CM

## 2024-08-04 DIAGNOSIS — Z96651 Presence of right artificial knee joint: Secondary | ICD-10-CM | POA: Diagnosis not present

## 2024-08-04 DIAGNOSIS — Z6826 Body mass index (BMI) 26.0-26.9, adult: Secondary | ICD-10-CM | POA: Diagnosis not present

## 2024-08-07 ENCOUNTER — Ambulatory Visit (INDEPENDENT_AMBULATORY_CARE_PROVIDER_SITE_OTHER): Admitting: Family Medicine

## 2024-08-07 ENCOUNTER — Other Ambulatory Visit (HOSPITAL_BASED_OUTPATIENT_CLINIC_OR_DEPARTMENT_OTHER): Payer: Self-pay

## 2024-08-07 ENCOUNTER — Encounter (HOSPITAL_BASED_OUTPATIENT_CLINIC_OR_DEPARTMENT_OTHER): Payer: Self-pay | Admitting: Family Medicine

## 2024-08-07 VITALS — BP 146/96 | HR 84 | Temp 97.4°F | Ht 73.23 in | Wt 195.0 lb

## 2024-08-07 DIAGNOSIS — I2510 Atherosclerotic heart disease of native coronary artery without angina pectoris: Secondary | ICD-10-CM | POA: Diagnosis not present

## 2024-08-07 DIAGNOSIS — M25551 Pain in right hip: Secondary | ICD-10-CM | POA: Insufficient documentation

## 2024-08-07 DIAGNOSIS — Z125 Encounter for screening for malignant neoplasm of prostate: Secondary | ICD-10-CM

## 2024-08-07 DIAGNOSIS — E1129 Type 2 diabetes mellitus with other diabetic kidney complication: Secondary | ICD-10-CM

## 2024-08-07 DIAGNOSIS — R809 Proteinuria, unspecified: Secondary | ICD-10-CM

## 2024-08-07 DIAGNOSIS — E785 Hyperlipidemia, unspecified: Secondary | ICD-10-CM

## 2024-08-07 DIAGNOSIS — I10 Essential (primary) hypertension: Secondary | ICD-10-CM

## 2024-08-07 DIAGNOSIS — H8110 Benign paroxysmal vertigo, unspecified ear: Secondary | ICD-10-CM | POA: Diagnosis not present

## 2024-08-07 DIAGNOSIS — Z23 Encounter for immunization: Secondary | ICD-10-CM | POA: Diagnosis not present

## 2024-08-07 DIAGNOSIS — M25552 Pain in left hip: Secondary | ICD-10-CM

## 2024-08-07 DIAGNOSIS — R5383 Other fatigue: Secondary | ICD-10-CM

## 2024-08-07 HISTORY — DX: Pain in right hip: M25.551

## 2024-08-07 HISTORY — DX: Benign paroxysmal vertigo, unspecified ear: H81.10

## 2024-08-07 NOTE — Progress Notes (Signed)
 Established Patient Office Visit  Subjective   Patient ID: Andrew Russo, male    DOB: [birth year]  Age: 68 y.o. MRN: [id number]  Chief Complaint  Patient presents with   Establish Care    Establish Care     F/u as above.  Known to our urgent care, but new to my practice.  He woke up with vertigo this morning.  Also had a pretty nasty fall last week and has been evaluated by the urgent care.  Recent right knee X-rays noted.  Both hips and right shoulder are also especially tender today.  He states he also struck the back of his head but his head and neck are considerably better.  He does have an upcoming appt with [facility] locally which is helpful.    Past Medical History:  Diagnosis Date   Anxiety    CAD (coronary artery disease)    Noncompliance with Cardiology follow up   Diabetes mellitus without complication (HCC)    Dyslipidemia    Hypertension    Medically noncompliant    Osteoarthritis    PE (pulmonary embolism)    Tobacco abuse    for 30+ years    Outpatient Encounter Medications as of 08/07/2024  Medication Sig   acetaminophen  (TYLENOL ) 500 MG tablet Take 1,500 mg by mouth every 6 (six) hours as needed for mild pain.   atorvastatin (LIPITOR) 80 MG tablet Take 80 mg by mouth daily.   busPIRone (BUSPAR) 10 MG tablet Take 10 mg by mouth 3 (three) times daily.   FARXIGA 10 MG TABS tablet Take 10 mg by mouth daily.   furosemide (LASIX) 40 MG tablet Take 40 mg by mouth daily.   gabapentin  (NEURONTIN ) 100 MG capsule TAKE 1 CAPSULE BY MOUTH THREE TIMES DAILY AS NEEDED   Insulin  Glargine (BASAGLAR KWIKPEN) 100 UNIT/ML INJECT 30 UNITS SUBCUTANEOUSLY ONCE DAILY Subcutaneous for 50 Days   labetalol (NORMODYNE) 100 MG tablet Take 100 mg by mouth 2 (two) times daily.   losartan (COZAAR) 100 MG tablet Take 100 mg by mouth daily.   metFORMIN (GLUCOPHAGE) 500 MG tablet Take 1,000 mg by mouth 2 (two) times daily with a meal.   traMADol (ULTRAM) 50 MG tablet Take 50 mg by  mouth every 8 (eight) hours as needed.   venlafaxine (EFFEXOR) 50 MG tablet Take 100 mg by mouth 2 (two) times daily.   atorvastatin (LIPITOR) 80 MG tablet Take 80 mg by mouth daily.   furosemide (LASIX) 40 MG tablet Take 40 mg by mouth daily.   No facility-administered encounter medications on file as of 08/07/2024.    Social History   Tobacco Use   Smoking status: Every Day    Current packs/day: 0.50    Types: Cigarettes   Smokeless tobacco: Never  Vaping Use   Vaping status: Never Used  Substance Use Topics   Alcohol  use: No   Drug use: Not Currently      Review of Systems  Constitutional:  Negative for diaphoresis, fever, malaise/fatigue and weight loss.  Respiratory:  Negative for cough, shortness of breath and wheezing.   Cardiovascular:  Negative for chest pain, palpitations, orthopnea, claudication, leg swelling and PND.      Objective:     BP (!) 146/96   Pulse 84   Temp (!) 97.4 F (36.3 C) (Oral)   Ht 6' 1.23 (1.86 m)   Wt 195 lb (88.5 kg)   SpO2 96%   BMI 25.57 kg/m    Physical Exam Constitutional:  General: He is not in acute distress.    Appearance: Normal appearance.     Comments: Comfortable and pleasant appearing.  Mentating well.  HENT:     Head: Normocephalic and atraumatic.  Neck:     Vascular: No carotid bruit.  Cardiovascular:     Rate and Rhythm: Normal rate and regular rhythm.     Pulses: Normal pulses.     Heart sounds: Normal heart sounds.  Pulmonary:     Effort: Pulmonary effort is normal.     Breath sounds: Normal breath sounds.  Abdominal:     General: Bowel sounds are normal.     Palpations: Abdomen is soft.  Musculoskeletal:     Cervical back: Neck supple. No tenderness.     Right lower leg: No edema.     Left lower leg: No edema.  Neurological:     General: No focal deficit present.     Mental Status: He is alert.      No results found for any visits on 08/07/24.    The ASCVD Risk score (Arnett DK, et  al., 2019) failed to calculate for the following reasons:   Risk score cannot be calculated because patient has a medical history suggesting prior/existing ASCVD    Assessment & Plan:  Essential hypertension Assessment & Plan: Satisfactory control.  DASH diet.   Encounter for immunization -     Flu vaccine HIGH DOSE PF(Fluzone Trivalent)  Benign paroxysmal positional vertigo, unspecified laterality Assessment & Plan: Probable.  Reassuring exam today.  Trial of Brandt-Daroff exercises and alert us  if not better soon.   Coronary artery disease involving native coronary artery of native heart without angina pectoris Assessment & Plan: Arrange for Cardiology follow up.  Orders: -     Ambulatory referral to Cardiology  Diabetes mellitus with microalbuminuria (HCC) -     Comprehensive metabolic panel with GFR -     CBC with Differential/Platelet -     Hemoglobin A1c  Dyslipidemia -     Lipid panel  Prostate cancer screening -     PSA  Fatigue, unspecified type -     TSH -     Vitamin B12  Hip pain, bilateral Assessment & Plan: Mild-moderate.  Able to walk comfortably.  Advised him to see [facility] locally this afternoon given his numerous musculoskeletal complaints after the recent fall.     Return in about 4 weeks (around 09/04/2024) for chronic follow-up.    REDDING PONCE NORLEEN FALCON., MD

## 2024-08-07 NOTE — Assessment & Plan Note (Signed)
 Probable.  Reassuring exam today.  Trial of Brandt-Daroff exercises and alert us  if not better soon.

## 2024-08-07 NOTE — Assessment & Plan Note (Signed)
 Mild-moderate.  Able to walk comfortably.  Advised him to see [facility] locally this afternoon given his numerous musculoskeletal complaints after the recent fall.

## 2024-08-07 NOTE — Progress Notes (Signed)
 Patient is in office today for a nurse visit for Immunization. Patient Injection was given in the  Left deltoid. Patient tolerated injection well.

## 2024-08-07 NOTE — Assessment & Plan Note (Signed)
 Arrange for Cardiology follow up.

## 2024-08-07 NOTE — Assessment & Plan Note (Signed)
 Satisfactory control.  DASH diet.

## 2024-08-08 ENCOUNTER — Ambulatory Visit (HOSPITAL_BASED_OUTPATIENT_CLINIC_OR_DEPARTMENT_OTHER): Payer: Self-pay | Admitting: Family Medicine

## 2024-08-08 LAB — LIPID PANEL
Chol/HDL Ratio: 2 ratio (ref 0.0–5.0)
Cholesterol, Total: 122 mg/dL (ref 100–199)
HDL: 60 mg/dL (ref >39)
LDL Chol Calc (NIH): 46 mg/dL (ref 0–99)
Triglycerides: 81 mg/dL (ref 0–149)
VLDL Cholesterol Cal: 16 mg/dL (ref 5–40)

## 2024-08-08 LAB — COMPREHENSIVE METABOLIC PANEL WITH GFR
ALT: 14 IU/L (ref 0–44)
AST: 11 IU/L (ref 0–40)
Albumin: 4.8 g/dL (ref 3.9–4.9)
Alkaline Phosphatase: 135 IU/L — ABNORMAL HIGH (ref 47–123)
BUN/Creatinine Ratio: 24 (ref 10–24)
BUN: 21 mg/dL (ref 8–27)
Bilirubin Total: 0.4 mg/dL (ref 0.0–1.2)
CO2: 22 mmol/L (ref 20–29)
Calcium: 9.6 mg/dL (ref 8.6–10.2)
Chloride: 100 mmol/L (ref 96–106)
Creatinine, Ser: 0.89 mg/dL (ref 0.76–1.27)
Globulin, Total: 1.8 g/dL (ref 1.5–4.5)
Glucose: 265 mg/dL — ABNORMAL HIGH (ref 70–99)
Potassium: 4.8 mmol/L (ref 3.5–5.2)
Sodium: 140 mmol/L (ref 134–144)
Total Protein: 6.6 g/dL (ref 6.0–8.5)
eGFR: 95 mL/min/1.73 (ref >59)

## 2024-08-08 LAB — CBC WITH DIFFERENTIAL/PLATELET
Basophils Absolute: 0.1 x10E3/uL (ref 0.0–0.2)
Basos: 1 %
EOS (ABSOLUTE): 0.1 x10E3/uL (ref 0.0–0.4)
Eos: 2 %
Hematocrit: 42.8 % (ref 37.5–51.0)
Hemoglobin: 14.3 g/dL (ref 13.0–17.7)
Immature Grans (Abs): 0.1 x10E3/uL (ref 0.0–0.1)
Immature Granulocytes: 1 %
Lymphocytes Absolute: 1.3 x10E3/uL (ref 0.7–3.1)
Lymphs: 19 %
MCH: 32.5 pg (ref 26.6–33.0)
MCHC: 33.4 g/dL (ref 31.5–35.7)
MCV: 97 fL (ref 79–97)
Monocytes Absolute: 0.5 x10E3/uL (ref 0.1–0.9)
Monocytes: 7 %
Neutrophils Absolute: 4.7 x10E3/uL (ref 1.4–7.0)
Neutrophils: 70 %
Platelets: 332 x10E3/uL (ref 150–450)
RBC: 4.4 x10E6/uL (ref 4.14–5.80)
RDW: 12.8 % (ref 11.6–15.4)
WBC: 6.8 x10E3/uL (ref 3.4–10.8)

## 2024-08-08 LAB — VITAMIN B12: Vitamin B-12: 262 pg/mL (ref 232–1245)

## 2024-08-08 LAB — HEMOGLOBIN A1C
Est. average glucose Bld gHb Est-mCnc: 183 mg/dL
Hgb A1c MFr Bld: 8 % — ABNORMAL HIGH (ref 4.8–5.6)

## 2024-08-08 LAB — TSH: TSH: 0.885 u[IU]/mL (ref 0.450–4.500)

## 2024-08-08 LAB — PSA: Prostate Specific Ag, Serum: 1.1 ng/mL (ref 0.0–4.0)

## 2024-08-21 ENCOUNTER — Ambulatory Visit: Attending: Cardiology | Admitting: Cardiology

## 2024-08-21 ENCOUNTER — Ambulatory Visit: Admitting: Cardiology

## 2024-08-21 ENCOUNTER — Encounter: Payer: Self-pay | Admitting: Cardiology

## 2024-08-21 VITALS — BP 120/66 | HR 92 | Ht 73.25 in | Wt 193.0 lb

## 2024-08-21 DIAGNOSIS — E782 Mixed hyperlipidemia: Secondary | ICD-10-CM | POA: Diagnosis not present

## 2024-08-21 DIAGNOSIS — I10 Essential (primary) hypertension: Secondary | ICD-10-CM | POA: Diagnosis not present

## 2024-08-21 DIAGNOSIS — E1142 Type 2 diabetes mellitus with diabetic polyneuropathy: Secondary | ICD-10-CM

## 2024-08-21 DIAGNOSIS — I2510 Atherosclerotic heart disease of native coronary artery without angina pectoris: Secondary | ICD-10-CM | POA: Diagnosis not present

## 2024-08-21 DIAGNOSIS — R0989 Other specified symptoms and signs involving the circulatory and respiratory systems: Secondary | ICD-10-CM

## 2024-08-21 DIAGNOSIS — Z951 Presence of aortocoronary bypass graft: Secondary | ICD-10-CM | POA: Diagnosis not present

## 2024-08-21 DIAGNOSIS — R0600 Dyspnea, unspecified: Secondary | ICD-10-CM | POA: Diagnosis not present

## 2024-08-21 DIAGNOSIS — I739 Peripheral vascular disease, unspecified: Secondary | ICD-10-CM

## 2024-08-21 DIAGNOSIS — F17200 Nicotine dependence, unspecified, uncomplicated: Secondary | ICD-10-CM | POA: Diagnosis not present

## 2024-08-21 HISTORY — DX: Nicotine dependence, unspecified, uncomplicated: F17.200

## 2024-08-21 HISTORY — DX: Presence of aortocoronary bypass graft: Z95.1

## 2024-08-21 MED ORDER — ASPIRIN 81 MG PO TBEC: 81.0000 mg | DELAYED_RELEASE_TABLET | Freq: Every day | ORAL | Status: AC

## 2024-08-21 NOTE — Progress Notes (Signed)
 Cardiology Consultation:    Date:  08/21/2024   ID:  Andrew Russo, DOB 14-Feb-1956, MRN [id number]  PCP:  Andrew Norleen PHEBE PONCE, MD  Cardiologist:  Andrew Fitch, MD   Referring MD: Andrew Norleen PHEBE PONCE, MD   No chief complaint on file. I want to be established with the patient  History of Present Illness:    Andrew Russo is a 68 y.o. male who is being seen today for the evaluation of history of coronary artery disease at the request of [place], John F. II, MD. complex past medical history, in 2013 he said he got a very small heart attack and after that and up having bypass surgery, since that time does not much follow-up.  Apparently did not have any stress test did not have any cardiac catheterization since then.  His surgery was done in May and he relocated to [place] couple years ago he changed his primary care physician and he was recommended to see us .  Additional problem include smoking which is still ongoing, diabetes mellitus, essential hypertension, dyslipidemia, neuropathy and peripheral.  Again he would like to be established as a patient overall he says he is doing fine denies have any chest pain tightness squeezing pressure burning chest, however, when he got his bypass surgery also did not have pain.  Also his ability to exercise limited because of pain in his legs he blamed this on neuropathy, but he was also told couple years ago he got poor circulation to his lower extremities.  Sadly he still continues to smoke, not on any diet, does not exercise on the regular basis.  Does have family history of coronary disease but no premature no TIA/CVA-like symptoms no palpitations no dizziness no swelling of lower extremities  Past Medical History:  Diagnosis Date   Anxiety    CAD (coronary artery disease)    Noncompliance with Cardiology follow up   Diabetes mellitus without complication (HCC)    Dyslipidemia    Hypertension    Medically noncompliant    Osteoarthritis     PE (pulmonary embolism)    Tobacco abuse    for 30+ years    Past Surgical History:  Procedure Laterality Date   COLONOSCOPY N/A    circa 2014 in [place]    neck fusion     REPLACEMENT TOTAL KNEE     SHOULDER SURGERY      Current Medications: Current Meds  Medication Sig   acetaminophen  (TYLENOL ) 500 MG tablet Take 1,500 mg by mouth every 6 (six) hours as needed for mild pain.   aspirin EC 81 MG tablet Take 1 tablet (81 mg total) by mouth daily. Swallow whole.   atorvastatin (LIPITOR) 80 MG tablet Take 80 mg by mouth daily.   busPIRone (BUSPAR) 10 MG tablet Take 10 mg by mouth 3 (three) times daily.   FARXIGA 10 MG TABS tablet Take 10 mg by mouth daily.   furosemide (LASIX) 40 MG tablet Take 40 mg by mouth daily.   Insulin  Glargine (BASAGLAR KWIKPEN) 100 UNIT/ML INJECT 30 UNITS SUBCUTANEOUSLY ONCE DAILY Subcutaneous for 50 Days   labetalol (NORMODYNE) 100 MG tablet Take 100 mg by mouth 2 (two) times daily.   losartan (COZAAR) 100 MG tablet Take 100 mg by mouth daily.   metFORMIN (GLUCOPHAGE) 500 MG tablet Take 1,000 mg by mouth 2 (two) times daily with a meal.   venlafaxine XR (EFFEXOR-XR) 150 MG 24 hr capsule Take 150 mg by mouth daily.     Allergies:  Patient has no known allergies.   Social History   Socioeconomic History   Marital status: Married    Spouse name: Not on file   Number of children: Not on file   Years of education: Not on file   Highest education level: Not on file  Occupational History   Occupation: Retired from ship [employer]  Tobacco Use   Smoking status: Every Day    Current packs/day: 0.50    Types: Cigarettes   Smokeless tobacco: Never  Vaping Use   Vaping status: Never Used  Substance and Sexual Activity   Alcohol  use: No   Drug use: Not Currently   Sexual activity: Not on file  Other Topics Concern   Not on file  Social History Narrative   Not on file   Social Drivers of Health   Financial Resource Strain: Not on file  Food  Insecurity: Medium Risk (10/09/2023)   Received from [facility]   Hunger Vital Sign    Within the past 12 months, you worried that your food would run out before you got money to buy more: Sometimes true    Within the past 12 months, the food you bought just didn't last and you didn't have money to get more. : Sometimes true  Transportation Needs: No Transportation Needs (10/09/2023)   Received from [facility]    In the past 12 months, has lack of reliable transportation kept you from medical appointments, meetings, work or from getting things needed for daily living? : No  Physical Activity: Not on file  Stress: Not on file  Social Connections: Not on file     Family History: The patient's family history is not on file. ROS:   Please see the history of present illness.    All 14 point review of systems negative except as described per history of present illness.  EKGs/Labs/Other Studies Reviewed:    The following studies were reviewed today:   EKG:  EKG Interpretation Date/Time:  Thursday August 21 2024 10:58:45 EDT Ventricular Rate:  92 PR Interval:  160 QRS Duration:  90 QT Interval:  388 QTC Calculation: 479 R Axis:   64  Text Interpretation: Sinus rhythm with marked sinus arrhythmia Nonspecific ST and T wave abnormality Prolonged QT No previous ECGs available Confirmed by Andrew Russo [phone]) on 08/21/2024 11:04:10 AM    Recent Labs: 08/07/2024: ALT 14; BUN 21; Creatinine, Ser 0.89; Hemoglobin 14.3; Platelets 332; Potassium 4.8; Sodium 140; TSH 0.885  Recent Lipid Panel    Component Value Date/Time   CHOL 122 08/07/2024 1045   TRIG 81 08/07/2024 1045   HDL 60 08/07/2024 1045   CHOLHDL 2.0 08/07/2024 1045   LDLCALC 46 08/07/2024 1045    Physical Exam:    VS:  BP 120/66   Pulse 92   Ht 6' 1.25 (1.861 m)   Wt 193 lb (87.5 kg)   SpO2 90%   BMI 25.29 kg/m     Wt Readings from Last 3 Encounters:  08/21/24 193 lb (87.5 kg)   08/07/24 195 lb (88.5 kg)     GEN:  Well nourished, well developed in no acute distress HEENT: Normal NECK: No JVD; No carotid bruits LYMPHATICS: No lymphadenopathy CARDIAC: RRR, no murmurs, no rubs, no gallops RESPIRATORY:  Clear to auscultation without rales, wheezing or rhonchi  ABDOMEN: Soft, non-tender, non-distended MUSCULOSKELETAL:  No edema; No deformity  SKIN: Warm and dry NEUROLOGIC:  Alert and oriented x 3 PSYCHIATRIC:  Normal affect  ASSESSMENT:    1. Essential hypertension   2. Coronary artery disease involving native coronary artery of native heart without angina pectoris   3. Carotid bruit, unspecified laterality   4. Dyspnea, unspecified type   5. Claudication in peripheral vascular disease   6. Status post coronary artery bypass graft 2013 in [place]   7. Type 2 diabetes mellitus with peripheral neuropathy (HCC)   8. Mixed hyperlipidemia   9. Smoking addiction    PLAN:    In order of problems listed above:  Coronary disease status post coronary bypass grafting 2013.  Does not have any typical symptoms suggest reactivation of the problem but with his risk factors namely longstanding diabetes smoking lack of follow-up since 2013 I am worried he may have some issue again we will evaluate him for ischemia most likely will do stress test but first I would like to do some additional testing. Diffuse advanced atherosclerosis status post coronary bypass graft.  Will schedule him to have carotic ultrasounds make sure he does not have any significant carotid artery stenosis. Pain in lower extremities could be related to neuropathy but I will do, ultrasounds of his lower extremities make sure he does not have significant critical narrowing of the artery leading blood to his legs. Dyslipidemia he is taking Lipitor 80 which is high intense statin excellent choice and his blood test I did review LDL of 46 HDL 60 this is from 08/07/2024 continue present management. He is not  taking any antiplatelet medication ask him to start taking 1 baby aspirin every single day. Smoking obviously huge problem and we have a long discussion about his he said he tried all different techniques to quit and so far unsuccessful we will continue working on diet   Medication Adjustments/Labs and Tests Ordered: Current medicines are reviewed at length with the patient today.  Concerns regarding medicines are outlined above.  Orders Placed This Encounter  Procedures   EKG 12-Lead   ECHOCARDIOGRAM COMPLETE   VAS US  CAROTID   VAS US  ABI WITH/WO TBI   VAS US  LOWER EXTREMITY ARTERIAL DUPLEX   Meds ordered this encounter  Medications   aspirin EC 81 MG tablet    Sig: Take 1 tablet (81 mg total) by mouth daily. Swallow whole.    Signed, Andrew DOROTHA Fitch, MD, [facility]. 08/21/2024 11:36 AM    [facility]

## 2024-08-21 NOTE — Patient Instructions (Signed)
 Medication Instructions:  Your physician has recommended you make the following change in your medication:   Start 81 mg coated aspirin daily.  *If you need a refill on your cardiac medications before your next appointment, please call your pharmacy*   Lab Work: None ordered If you have labs (blood work) drawn today and your tests are completely normal, you will receive your results only by: MyChart Message (if you have MyChart) OR A paper copy in the mail If you have any lab test that is abnormal or we need to change your treatment, we will call you to review the results.  Testing/Procedures: Your physician has requested that you have an echocardiogram. Echocardiography is a painless test that uses sound waves to create images of your heart. It provides your doctor with information about the size and shape of your heart and how well your heart's chambers and valves are working. This procedure takes approximately one hour. There are no restrictions for this procedure. Please do NOT wear cologne, perfume, aftershave, or lotions (deodorant is allowed). Please arrive 15 minutes prior to your appointment time.  Please note: We ask at that you not bring children with you during ultrasound (echo/ vascular) testing. Due to room size and safety concerns, children are not allowed in the ultrasound rooms during exams. Our front office staff cannot provide observation of children in our lobby area while testing is being conducted. An adult accompanying a patient to their appointment will only be allowed in the ultrasound room at the discretion of the ultrasound technician under special circumstances. We apologize for any inconvenience.  Your physician has requested that you have an ankle brachial index (ABI). During this test an ultrasound and blood pressure cuff are used to evaluate the arteries that supply the arms and legs with blood. Allow thirty minutes for this exam. There are no restrictions or  special instructions.  Please note: We ask at that you not bring children with you during ultrasound (echo/ vascular) testing. Due to room size and safety concerns, children are not allowed in the ultrasound rooms during exams. Our front office staff cannot provide observation of children in our lobby area while testing is being conducted. An adult accompanying a patient to their appointment will only be allowed in the ultrasound room at the discretion of the ultrasound technician under special circumstances. We apologize for any inconvenience.  Your physician has requested that you have a carotid duplex. This test is an ultrasound of the carotid arteries in your neck. It looks at blood flow through these arteries that supply the brain with blood. Allow one hour for this exam. There are no restrictions or special instructions.   Follow-Up: At [facility], you and your health needs are our priority.  As part of our continuing mission to provide you with exceptional heart care, we have created designated Provider Care Teams.  These Care Teams include your primary Cardiologist (physician) and Advanced Practice Providers (APPs -  Physician Assistants and Nurse Practitioners) who all work together to provide you with the care you need, when you need it.  We recommend signing up for the patient portal called MyChart.  Sign up information is provided on this After Visit Summary.  MyChart is used to connect with patients for Virtual Visits (Telemedicine).  Patients are able to view lab/test results, encounter notes, upcoming appointments, etc.  Non-urgent messages can be sent to your provider as well.   To learn more about what you can do with MyChart, go  to [email or web address].    Your next appointment:   2 month(s)  The format for your next appointment:   In Person  Provider:   Lamar Fitch, MD   Other Instructions Echocardiogram An echocardiogram is a test that uses sound waves  (ultrasound) to produce images of the heart. Images from an echocardiogram can provide important information about: Heart size and shape. The size and thickness and movement of your heart's walls. Heart muscle function and strength. Heart valve function or if you have stenosis. Stenosis is when the heart valves are too narrow. If blood is flowing backward through the heart valves (regurgitation). A tumor or infectious growth around the heart valves. Areas of heart muscle that are not working well because of poor blood flow or injury from a heart attack. Aneurysm detection. An aneurysm is a weak or damaged part of an artery wall. The wall bulges out from the normal force of blood pumping through the body. Tell a health care provider about: Any allergies you have. All medicines you are taking, including vitamins, herbs, eye drops, creams, and over-the-counter medicines. Any blood disorders you have. Any surgeries you have had. Any medical conditions you have. Whether you are pregnant or may be pregnant. What are the risks? Generally, this is a safe test. However, problems may occur, including an allergic reaction to dye (contrast) that may be used during the test. What happens before the test? No specific preparation is needed. You may eat and drink normally. What happens during the test? You will take off your clothes from the waist up and put on a hospital gown. Electrodes or electrocardiogram (ECG)patches may be placed on your chest. The electrodes or patches are then connected to a device that monitors your heart rate and rhythm. You will lie down on a table for an ultrasound exam. A gel will be applied to your chest to help sound waves pass through your skin. A handheld device, called a transducer, will be pressed against your chest and moved over your heart. The transducer produces sound waves that travel to your heart and bounce back (or echo back) to the transducer. These sound waves  will be captured in real-time and changed into images of your heart that can be viewed on a video monitor. The images will be recorded on a computer and reviewed by your health care provider. You may be asked to change positions or hold your breath for a short time. This makes it easier to get different views or better views of your heart. In some cases, you may receive contrast through an IV in one of your veins. This can improve the quality of the pictures from your heart. The procedure may vary among health care providers and hospitals.   What can I expect after the test? You may return to your normal, everyday life, including diet, activities, and medicines, unless your health care provider tells you not to do that. Follow these instructions at home: It is up to you to get the results of your test. Ask your health care provider, or the department that is doing the test, when your results will be ready. Keep all follow-up visits. This is important. Summary An echocardiogram is a test that uses sound waves (ultrasound) to produce images of the heart. Images from an echocardiogram can provide important information about the size and shape of your heart, heart muscle function, heart valve function, and other possible heart problems. You do not need to do anything to prepare before  this test. You may eat and drink normally. After the echocardiogram is completed, you may return to your normal, everyday life, unless your health care provider tells you not to do that. This information is not intended to replace advice given to you by your health care provider. Make sure you discuss any questions you have with your health care provider. Document Revised: 06/08/2020 Document Reviewed: 06/08/2020 Elsevier Patient Education  2021 Elsevier Inc.   Important Information About Sugar

## 2024-08-26 DIAGNOSIS — E7849 Other hyperlipidemia: Secondary | ICD-10-CM | POA: Diagnosis not present

## 2024-08-26 DIAGNOSIS — M5459 Other low back pain: Secondary | ICD-10-CM | POA: Diagnosis not present

## 2024-08-26 DIAGNOSIS — I700 Atherosclerosis of aorta: Secondary | ICD-10-CM | POA: Diagnosis not present

## 2024-08-26 DIAGNOSIS — M21371 Foot drop, right foot: Secondary | ICD-10-CM | POA: Diagnosis not present

## 2024-08-26 DIAGNOSIS — G63 Polyneuropathy in diseases classified elsewhere: Secondary | ICD-10-CM | POA: Diagnosis not present

## 2024-08-26 DIAGNOSIS — M5416 Radiculopathy, lumbar region: Secondary | ICD-10-CM | POA: Diagnosis not present

## 2024-08-26 DIAGNOSIS — M961 Postlaminectomy syndrome, not elsewhere classified: Secondary | ICD-10-CM | POA: Diagnosis not present

## 2024-08-26 DIAGNOSIS — E1143 Type 2 diabetes mellitus with diabetic autonomic (poly)neuropathy: Secondary | ICD-10-CM | POA: Diagnosis not present

## 2024-08-26 DIAGNOSIS — M549 Dorsalgia, unspecified: Secondary | ICD-10-CM | POA: Diagnosis not present

## 2024-08-26 HISTORY — DX: Polyneuropathy in diseases classified elsewhere: G63

## 2024-08-26 HISTORY — DX: Atherosclerosis of aorta: I70.0

## 2024-08-26 HISTORY — DX: Radiculopathy, lumbar region: M54.16

## 2024-08-26 HISTORY — DX: Postlaminectomy syndrome, not elsewhere classified: M96.1

## 2024-08-27 ENCOUNTER — Other Ambulatory Visit (HOSPITAL_BASED_OUTPATIENT_CLINIC_OR_DEPARTMENT_OTHER): Payer: Self-pay

## 2024-08-27 MED ORDER — GABAPENTIN 100 MG PO CAPS
100.0000 mg | ORAL_CAPSULE | Freq: Three times a day (TID) | ORAL | 2 refills | Status: DC | PRN
  Filled 2024-08-27: qty 90, 30d supply, fill #0

## 2024-08-27 MED ORDER — BASAGLAR KWIKPEN 100 UNIT/ML ~~LOC~~ SOPN
30.0000 [IU] | PEN_INJECTOR | Freq: Every day | SUBCUTANEOUS | 4 refills | Status: DC
  Filled 2024-08-27: qty 15, 50d supply, fill #0

## 2024-08-27 MED ORDER — BUSPIRONE HCL 10 MG PO TABS
10.0000 mg | ORAL_TABLET | Freq: Three times a day (TID) | ORAL | 2 refills | Status: DC
  Filled 2024-08-27: qty 90, 30d supply, fill #0

## 2024-08-27 MED ORDER — VENLAFAXINE HCL ER 150 MG PO CP24
150.0000 mg | ORAL_CAPSULE | Freq: Every day | ORAL | 2 refills | Status: DC
  Filled 2024-08-27: qty 30, 30d supply, fill #0

## 2024-08-28 ENCOUNTER — Other Ambulatory Visit (HOSPITAL_BASED_OUTPATIENT_CLINIC_OR_DEPARTMENT_OTHER): Payer: Self-pay

## 2024-08-28 ENCOUNTER — Other Ambulatory Visit (HOSPITAL_BASED_OUTPATIENT_CLINIC_OR_DEPARTMENT_OTHER): Payer: Self-pay | Admitting: Family Medicine

## 2024-08-28 DIAGNOSIS — E1142 Type 2 diabetes mellitus with diabetic polyneuropathy: Secondary | ICD-10-CM

## 2024-08-28 MED ORDER — LABETALOL HCL 100 MG PO TABS
100.0000 mg | ORAL_TABLET | Freq: Two times a day (BID) | ORAL | 0 refills | Status: DC
  Filled 2024-08-28: qty 180, 90d supply, fill #0

## 2024-08-28 MED ORDER — FARXIGA 10 MG PO TABS
10.0000 mg | ORAL_TABLET | Freq: Every day | ORAL | 0 refills | Status: DC
  Filled 2024-08-28: qty 90, 90d supply, fill #0

## 2024-09-02 ENCOUNTER — Other Ambulatory Visit (HOSPITAL_BASED_OUTPATIENT_CLINIC_OR_DEPARTMENT_OTHER): Payer: Self-pay

## 2024-09-02 ENCOUNTER — Other Ambulatory Visit (HOSPITAL_BASED_OUTPATIENT_CLINIC_OR_DEPARTMENT_OTHER): Payer: Self-pay | Admitting: Family Medicine

## 2024-09-02 DIAGNOSIS — I10 Essential (primary) hypertension: Secondary | ICD-10-CM

## 2024-09-02 MED ORDER — LOSARTAN POTASSIUM 100 MG PO TABS
100.0000 mg | ORAL_TABLET | Freq: Every day | ORAL | 1 refills | Status: AC
  Filled 2024-09-02: qty 90, 90d supply, fill #0
  Filled 2024-12-02: qty 90, 90d supply, fill #1

## 2024-09-02 NOTE — Telephone Encounter (Signed)
 Copied from CRM #[id number]. Topic: Clinical - Medication Refill >> Sep 02, 2024 10:12 AM Amber H wrote: Medication: losartan (COZAAR) 100 MG tablet [[id number]]  Has the patient contacted their pharmacy? Yes, was advised to contact doctor. Patient was seeing a different doctor at another clinic due to no longer practicing . Previous doctor  prescribed medication however, switched to new doctor. (Agent: If no, request that the patient contact the pharmacy for the refill. If patient does not wish to contact the pharmacy document the reason why and proceed with request.) (Agent: If yes, when and what did the pharmacy advise?)  This is the patient's preferred pharmacy:  [facility] [address], [address] [place] [address] Phone: [phone] Fax: [phone]  Is this the correct pharmacy for this prescription? Yes If no, delete pharmacy and type the correct one.   Has the prescription been filled recently? Yes, 06/07/2024  Is the patient out of the medication? Yes  Has the patient been seen for an appointment in the last year OR does the patient have an upcoming appointment? Yes  Can we respond through MyChart? Yes  Agent: Please be advised that Rx refills may take up to 3 business days. We ask that you follow-up with your pharmacy.

## 2024-09-04 ENCOUNTER — Encounter (HOSPITAL_BASED_OUTPATIENT_CLINIC_OR_DEPARTMENT_OTHER): Payer: Self-pay | Admitting: Family Medicine

## 2024-09-04 ENCOUNTER — Ambulatory Visit (INDEPENDENT_AMBULATORY_CARE_PROVIDER_SITE_OTHER): Admitting: Family Medicine

## 2024-09-04 ENCOUNTER — Other Ambulatory Visit (HOSPITAL_BASED_OUTPATIENT_CLINIC_OR_DEPARTMENT_OTHER): Payer: Self-pay

## 2024-09-04 VITALS — BP 140/69 | HR 75 | Temp 97.9°F | Resp 16 | Wt 191.8 lb

## 2024-09-04 DIAGNOSIS — D510 Vitamin B12 deficiency anemia due to intrinsic factor deficiency: Secondary | ICD-10-CM

## 2024-09-04 DIAGNOSIS — M545 Low back pain, unspecified: Secondary | ICD-10-CM

## 2024-09-04 DIAGNOSIS — Z23 Encounter for immunization: Secondary | ICD-10-CM | POA: Diagnosis not present

## 2024-09-04 DIAGNOSIS — E1142 Type 2 diabetes mellitus with diabetic polyneuropathy: Secondary | ICD-10-CM

## 2024-09-04 DIAGNOSIS — G8929 Other chronic pain: Secondary | ICD-10-CM | POA: Insufficient documentation

## 2024-09-04 DIAGNOSIS — G894 Chronic pain syndrome: Secondary | ICD-10-CM

## 2024-09-04 HISTORY — DX: Vitamin B12 deficiency anemia due to intrinsic factor deficiency: D51.0

## 2024-09-04 HISTORY — DX: Other chronic pain: G89.29

## 2024-09-04 MED ORDER — TRAMADOL HCL 50 MG PO TABS
50.0000 mg | ORAL_TABLET | Freq: Two times a day (BID) | ORAL | 0 refills | Status: AC | PRN
  Filled 2024-09-04: qty 42, 21d supply, fill #0

## 2024-09-04 NOTE — Assessment & Plan Note (Signed)
 He is frustrated with [facility], and I'll send him over to [facility] who I consider [facility].  Of note, he doesn't feel his statin is exacerbating his problem.

## 2024-09-04 NOTE — Progress Notes (Signed)
 Established Patient Office Visit  Subjective   Patient ID: Andrew Russo, male    DOB: 08/22/1956  Age: 68 y.o. MRN: [id number]  Chief Complaint  Patient presents with   Follow-up    Follow-up    F/u as above.  Please see previous notes for details.  He is especially concerned about his somewhat diffuse OA and secondary pain.  Already well known to [facility].  Hasn't responded to Motrin .  He has tried Tramadol before and it was somewhat helpful.  Has not benefited much from PT.  Our extended discussion regarding his pain today, limited our ability to discuss other issues.  Recent labs noted.    Past Medical History:  Diagnosis Date   Anxiety    CAD (coronary artery disease)    f/by [facility]   Diabetes mellitus without complication (HCC)    Dyslipidemia    Hypertension    Medically noncompliant    Osteoarthritis    PE (pulmonary embolism)    Tobacco abuse    for 30+ years    Outpatient Encounter Medications as of 09/04/2024  Medication Sig   acetaminophen  (TYLENOL ) 500 MG tablet Take 1,500 mg by mouth every 6 (six) hours as needed for mild pain.   aspirin EC 81 MG tablet Take 1 tablet (81 mg total) by mouth daily. Swallow whole.   atorvastatin (LIPITOR) 80 MG tablet Take 80 mg by mouth daily.   busPIRone (BUSPAR) 10 MG tablet Take 10 mg by mouth 3 (three) times daily.   FARXIGA 10 MG TABS tablet Take 1 tablet (10 mg total) by mouth daily.   furosemide (LASIX) 40 MG tablet Take 40 mg by mouth daily.   Insulin  Glargine (BASAGLAR KWIKPEN) 100 UNIT/ML INJECT 30 UNITS SUBCUTANEOUSLY ONCE DAILY Subcutaneous for 50 Days   Insulin  Glargine (BASAGLAR KWIKPEN) 100 UNIT/ML Inject 30 Units into the skin daily.   labetalol (NORMODYNE) 100 MG tablet Take 100 mg by mouth 2 (two) times daily.   labetalol (NORMODYNE) 100 MG tablet Take 1 tablet (100 mg total) by mouth 2 (two) times daily.   losartan (COZAAR) 100 MG tablet Take 1 tablet (100 mg total) by mouth daily.   metFORMIN  (GLUCOPHAGE) 500 MG tablet Take 1,000 mg by mouth 2 (two) times daily with a meal.   traMADol (ULTRAM) 50 MG tablet Take 1 tablet (50 mg total) by mouth every 12 (twelve) hours as needed (Sedation precautions and avoid alcohol ).   [DISCONTINUED] busPIRone (BUSPAR) 10 MG tablet Take 1 tablet (10 mg total) by mouth 3 (three) times daily for anxiety   [DISCONTINUED] venlafaxine XR (EFFEXOR-XR) 150 MG 24 hr capsule Take 150 mg by mouth daily.   [DISCONTINUED] venlafaxine XR (EFFEXOR-XR) 150 MG 24 hr capsule Take 1 capsule (150 mg total) by mouth daily.   [DISCONTINUED] gabapentin  (NEURONTIN ) 100 MG capsule Take 1 capsule (100 mg total) by mouth 3 (three) times daily as needed.   No facility-administered encounter medications on file as of 09/04/2024.    Social History   Tobacco Use   Smoking status: Every Day    Current packs/day: 0.50    Types: Cigarettes   Smokeless tobacco: Never  Vaping Use   Vaping status: Never Used  Substance Use Topics   Alcohol  use: No   Drug use: Not Currently      Review of Systems  Constitutional:  Negative for diaphoresis, fever, malaise/fatigue and weight loss.  Respiratory:  Negative for cough, shortness of breath and wheezing.   Cardiovascular:  Negative for chest pain, palpitations, orthopnea, claudication, leg swelling and PND.      Objective:     BP (!) 140/69 (Cuff Size: Normal)   Pulse 75   Temp 97.9 F (36.6 C) (Oral)   Resp 16   Wt 191 lb 12.8 oz (87 kg)   SpO2 95%   BMI 25.13 kg/m    Physical Exam Constitutional:      General: He is not in acute distress.    Appearance: Normal appearance.  HENT:     Head: Normocephalic.  Neck:     Vascular: No carotid bruit.  Cardiovascular:     Rate and Rhythm: Normal rate and regular rhythm.     Pulses: Normal pulses.     Heart sounds: Normal heart sounds.  Pulmonary:     Effort: Pulmonary effort is normal.     Breath sounds: Normal breath sounds.  Abdominal:     General: Bowel  sounds are normal.     Palpations: Abdomen is soft.  Musculoskeletal:     Cervical back: Neck supple. No tenderness.     Right lower leg: No edema.     Left lower leg: No edema.  Neurological:     Mental Status: He is alert.      No results found for any visits on 09/04/24.    The ASCVD Risk score (Arnett DK, et al., 2019) failed to calculate for the following reasons:   Risk score cannot be calculated because patient has a medical history suggesting prior/existing ASCVD    Assessment & Plan:  Chronic bilateral low back pain without sciatica Assessment & Plan: He is frustrated with [facility], and I'll send him over to [facility] who I consider [facility].  Of note, he doesn't feel his statin is exacerbating his problem.    Orders: -     Ambulatory referral to Orthopedic Surgery -     traMADol HCl; Take 1 tablet (50 mg total) by mouth every 12 (twelve) hours as needed (Sedation precautions and avoid alcohol ).  Dispense: 42 tablet; Refill: 0  Encounter for immunization -     Flu vaccine HIGH DOSE PF(Fluzone Trivalent)  Chronic pain syndrome  Type 2 diabetes mellitus with peripheral neuropathy (HCC) Assessment & Plan: Recent A1c was unacceptable and I urged him to check several sugars weekly and start updating us  weekly.  Limited time on this today, since his arthritic pain dominated today's conversation.   Pernicious anemia Assessment & Plan: Start B12 shots soon with education provided.     Return in about 4 weeks (around 10/02/2024) for chronic follow-up.    [facility] PONCE NORLEEN FALCON., MD

## 2024-09-04 NOTE — Progress Notes (Signed)
 Patient is in office today for a nurse visit for Immunization. Patient Injection was given in the  Right deltoid. Patient tolerated injection well.

## 2024-09-04 NOTE — Assessment & Plan Note (Signed)
 Start B12 shots soon with education provided.

## 2024-09-04 NOTE — Assessment & Plan Note (Signed)
 Recent A1c was unacceptable and I urged him to check several sugars weekly and start updating us  weekly.  Limited time on this today, since his arthritic pain dominated today's conversation.

## 2024-09-09 ENCOUNTER — Other Ambulatory Visit (HOSPITAL_BASED_OUTPATIENT_CLINIC_OR_DEPARTMENT_OTHER): Payer: Self-pay | Admitting: Physical Medicine and Rehabilitation

## 2024-09-09 DIAGNOSIS — M5459 Other low back pain: Secondary | ICD-10-CM

## 2024-09-10 ENCOUNTER — Ambulatory Visit (INDEPENDENT_AMBULATORY_CARE_PROVIDER_SITE_OTHER)
Admission: RE | Admit: 2024-09-10 | Discharge: 2024-09-10 | Disposition: A | Discharge status: Home / Self Care | Source: Ambulatory Visit | Attending: Physical Medicine and Rehabilitation | Admitting: Physical Medicine and Rehabilitation

## 2024-09-10 DIAGNOSIS — M5459 Other low back pain: Secondary | ICD-10-CM | POA: Diagnosis not present

## 2024-09-10 DIAGNOSIS — M4317 Spondylolisthesis, lumbosacral region: Secondary | ICD-10-CM | POA: Diagnosis not present

## 2024-09-10 DIAGNOSIS — M47816 Spondylosis without myelopathy or radiculopathy, lumbar region: Secondary | ICD-10-CM | POA: Diagnosis not present

## 2024-09-10 DIAGNOSIS — M48061 Spinal stenosis, lumbar region without neurogenic claudication: Secondary | ICD-10-CM | POA: Diagnosis not present

## 2024-09-10 DIAGNOSIS — M419 Scoliosis, unspecified: Secondary | ICD-10-CM | POA: Diagnosis not present

## 2024-09-11 ENCOUNTER — Telehealth (HOSPITAL_BASED_OUTPATIENT_CLINIC_OR_DEPARTMENT_OTHER): Payer: Self-pay

## 2024-09-11 NOTE — Telephone Encounter (Signed)
 Called patient wife and made her aware he is due for his CPE in feb, she stated she would like to keep appointment for 12/06/23, so I changed that appointment to a Physical.   Copied from CRM [id number]. Topic: General - Call Back - No Documentation >> Sep 09, 2024  4:09 PM Joesph B wrote: Reason for CRM: patients wife stated someone called her to schedule her husband physical but he's had one this year. Please follow up  [phone].

## 2024-09-15 ENCOUNTER — Ambulatory Visit (INDEPENDENT_AMBULATORY_CARE_PROVIDER_SITE_OTHER)

## 2024-09-15 ENCOUNTER — Ambulatory Visit

## 2024-09-15 DIAGNOSIS — I2510 Atherosclerotic heart disease of native coronary artery without angina pectoris: Secondary | ICD-10-CM | POA: Diagnosis not present

## 2024-09-15 DIAGNOSIS — R0600 Dyspnea, unspecified: Secondary | ICD-10-CM

## 2024-09-15 DIAGNOSIS — I739 Peripheral vascular disease, unspecified: Secondary | ICD-10-CM

## 2024-09-15 LAB — ECHOCARDIOGRAM COMPLETE
AR max vel: 2.5 cm2
AV Area VTI: 2.93 cm2
AV Area mean vel: 2.46 cm2
AV Mean grad: 2 mmHg
AV Peak grad: 3.4 mmHg
Ao pk vel: 0.92 m/s
Area-P 1/2: 1.87 cm2
Calc EF: 58 %
MV VTI: 1.29 cm2
S' Lateral: 3.2 cm
Single Plane A2C EF: 57.7 %
Single Plane A4C EF: 51.5 %

## 2024-09-15 LAB — VAS US ABI WITH/WO TBI
Left ABI: 1.58
Right ABI: 1.27

## 2024-09-15 MED ORDER — PERFLUTREN LIPID MICROSPHERE
1.0000 mL | INTRAVENOUS | Status: AC | PRN
  Administered 2024-09-15: 5 mL via INTRAVENOUS

## 2024-09-16 ENCOUNTER — Ambulatory Visit

## 2024-09-16 DIAGNOSIS — R0989 Other specified symptoms and signs involving the circulatory and respiratory systems: Secondary | ICD-10-CM

## 2024-09-16 DIAGNOSIS — M1711 Unilateral primary osteoarthritis, right knee: Secondary | ICD-10-CM

## 2024-09-16 DIAGNOSIS — M25551 Pain in right hip: Secondary | ICD-10-CM

## 2024-09-16 DIAGNOSIS — M1611 Unilateral primary osteoarthritis, right hip: Secondary | ICD-10-CM

## 2024-09-16 HISTORY — DX: Pain in right hip: M25.551

## 2024-09-16 HISTORY — DX: Unilateral primary osteoarthritis, right hip: M16.11

## 2024-09-16 HISTORY — DX: Unilateral primary osteoarthritis, right knee: M17.11

## 2024-09-17 ENCOUNTER — Ambulatory Visit: Payer: Self-pay | Admitting: Cardiology

## 2024-09-17 DIAGNOSIS — M47819 Spondylosis without myelopathy or radiculopathy, site unspecified: Secondary | ICD-10-CM | POA: Diagnosis not present

## 2024-09-18 ENCOUNTER — Other Ambulatory Visit (HOSPITAL_BASED_OUTPATIENT_CLINIC_OR_DEPARTMENT_OTHER): Payer: Self-pay | Admitting: Orthopedic Surgery

## 2024-09-18 DIAGNOSIS — M25551 Pain in right hip: Secondary | ICD-10-CM

## 2024-09-19 ENCOUNTER — Telehealth (HOSPITAL_BASED_OUTPATIENT_CLINIC_OR_DEPARTMENT_OTHER): Payer: Self-pay | Admitting: Family Medicine

## 2024-09-19 ENCOUNTER — Telehealth (HOSPITAL_BASED_OUTPATIENT_CLINIC_OR_DEPARTMENT_OTHER): Payer: Self-pay | Admitting: *Deleted

## 2024-09-19 ENCOUNTER — Other Ambulatory Visit (HOSPITAL_BASED_OUTPATIENT_CLINIC_OR_DEPARTMENT_OTHER): Payer: Self-pay

## 2024-09-19 ENCOUNTER — Telehealth: Payer: Self-pay

## 2024-09-19 ENCOUNTER — Other Ambulatory Visit (HOSPITAL_BASED_OUTPATIENT_CLINIC_OR_DEPARTMENT_OTHER): Payer: Self-pay | Admitting: Family Medicine

## 2024-09-19 DIAGNOSIS — F419 Anxiety disorder, unspecified: Secondary | ICD-10-CM

## 2024-09-19 DIAGNOSIS — E1142 Type 2 diabetes mellitus with diabetic polyneuropathy: Secondary | ICD-10-CM

## 2024-09-19 MED ORDER — VENLAFAXINE HCL ER 150 MG PO CP24
150.0000 mg | ORAL_CAPSULE | Freq: Every day | ORAL | 1 refills | Status: AC
  Filled 2024-09-19: qty 90, 90d supply, fill #0

## 2024-09-19 MED ORDER — METFORMIN HCL 500 MG PO TABS
1000.0000 mg | ORAL_TABLET | Freq: Two times a day (BID) | ORAL | 1 refills | Status: AC
  Filled 2024-09-19: qty 360, 90d supply, fill #0

## 2024-09-19 NOTE — Telephone Encounter (Unsigned)
 Copied from CRM [id number]. Topic: Clinical - Medication Refill >> Sep 19, 2024  4:11 PM Alfonso HERO wrote: Medication: metFORMIN  (GLUCOPHAGE ) 500 MG tablet Venlafaxine  100 MG   Has the patient contacted their pharmacy? No (Agent: If no, request that the patient contact the pharmacy for the refill. If patient does not wish to contact the pharmacy document the reason why and proceed with request.) (Agent: If yes, when and what did the pharmacy advise?)  This is the patient's preferred pharmacy:  [facility] [address], [address] [place] [address] Phone: [phone] Fax: [phone]  Is this the correct pharmacy for this prescription? Yes If no, delete pharmacy and type the correct one.   Has the prescription been filled recently? Yes  Is the patient out of the medication? Yes  Has the patient been seen for an appointment in the last year OR does the patient have an upcoming appointment? Yes  Can we respond through MyChart? Yes  Agent: Please be advised that Rx refills may take up to 3 business days. We ask that you follow-up with your pharmacy.

## 2024-09-19 NOTE — Telephone Encounter (Signed)
 Left message on My Chart with [place] results per Dr. Vanetta Shawl note. Routed to PCP.

## 2024-09-19 NOTE — Telephone Encounter (Signed)
 Left message on My Chart with Echo results per Dr. Karry note. Routed to PCP.

## 2024-09-22 ENCOUNTER — Other Ambulatory Visit (HOSPITAL_BASED_OUTPATIENT_CLINIC_OR_DEPARTMENT_OTHER): Payer: Self-pay

## 2024-09-22 NOTE — Telephone Encounter (Signed)
 Pt was called.

## 2024-09-24 ENCOUNTER — Ambulatory Visit (HOSPITAL_BASED_OUTPATIENT_CLINIC_OR_DEPARTMENT_OTHER)
Admission: RE | Admit: 2024-09-24 | Discharge: 2024-09-24 | Disposition: A | Discharge status: Home / Self Care | Source: Ambulatory Visit | Attending: Orthopedic Surgery | Admitting: Orthopedic Surgery

## 2024-09-24 DIAGNOSIS — M25551 Pain in right hip: Secondary | ICD-10-CM

## 2024-09-24 DIAGNOSIS — S73191A Other sprain of right hip, initial encounter: Secondary | ICD-10-CM | POA: Diagnosis not present

## 2024-09-24 DIAGNOSIS — M160 Bilateral primary osteoarthritis of hip: Secondary | ICD-10-CM | POA: Diagnosis not present

## 2024-09-26 ENCOUNTER — Other Ambulatory Visit (HOSPITAL_BASED_OUTPATIENT_CLINIC_OR_DEPARTMENT_OTHER): Payer: Self-pay | Admitting: Family Medicine

## 2024-09-26 DIAGNOSIS — G8929 Other chronic pain: Secondary | ICD-10-CM

## 2024-09-30 ENCOUNTER — Other Ambulatory Visit (HOSPITAL_BASED_OUTPATIENT_CLINIC_OR_DEPARTMENT_OTHER): Payer: Self-pay | Admitting: Family Medicine

## 2024-09-30 DIAGNOSIS — E1142 Type 2 diabetes mellitus with diabetic polyneuropathy: Secondary | ICD-10-CM

## 2024-10-01 ENCOUNTER — Encounter (HOSPITAL_BASED_OUTPATIENT_CLINIC_OR_DEPARTMENT_OTHER): Payer: Self-pay

## 2024-10-02 ENCOUNTER — Other Ambulatory Visit (HOSPITAL_BASED_OUTPATIENT_CLINIC_OR_DEPARTMENT_OTHER): Payer: Self-pay | Admitting: Family Medicine

## 2024-10-02 ENCOUNTER — Other Ambulatory Visit (HOSPITAL_BASED_OUTPATIENT_CLINIC_OR_DEPARTMENT_OTHER): Payer: Self-pay

## 2024-10-02 DIAGNOSIS — M545 Low back pain, unspecified: Secondary | ICD-10-CM

## 2024-10-08 ENCOUNTER — Ambulatory Visit: Attending: Cardiology

## 2024-10-08 DIAGNOSIS — I739 Peripheral vascular disease, unspecified: Secondary | ICD-10-CM

## 2024-10-09 NOTE — Telephone Encounter (Signed)
 Spoke to pt. He is seeing several orthopedics.He still has some untreated pain. States he has not gotten any other pain meds besides the tramadol  last sent by Dr. Dottie. Appt for follow up made for 10/16/2024

## 2024-10-10 ENCOUNTER — Other Ambulatory Visit (HOSPITAL_BASED_OUTPATIENT_CLINIC_OR_DEPARTMENT_OTHER): Payer: Self-pay

## 2024-10-10 NOTE — Telephone Encounter (Signed)
 Santana (wife per [facility]) advised to have pt call ortho for pain management tx.

## 2024-10-13 ENCOUNTER — Telehealth: Payer: Self-pay

## 2024-10-13 NOTE — Telephone Encounter (Signed)
 Pt viewed [place] results on My Chart per Dr. Vanetta Shawl note. Routed to PCP.

## 2024-10-16 ENCOUNTER — Ambulatory Visit (HOSPITAL_BASED_OUTPATIENT_CLINIC_OR_DEPARTMENT_OTHER): Admitting: Family Medicine

## 2024-10-17 ENCOUNTER — Encounter: Payer: Self-pay | Admitting: *Deleted

## 2024-10-21 ENCOUNTER — Telehealth (HOSPITAL_BASED_OUTPATIENT_CLINIC_OR_DEPARTMENT_OTHER): Payer: Self-pay | Admitting: Family Medicine

## 2024-10-21 ENCOUNTER — Ambulatory Visit: Attending: Cardiology | Admitting: Cardiology

## 2024-10-21 ENCOUNTER — Telehealth: Payer: Self-pay | Admitting: *Deleted

## 2024-10-21 ENCOUNTER — Ambulatory Visit: Admitting: Cardiology

## 2024-10-21 ENCOUNTER — Encounter: Payer: Self-pay | Admitting: Cardiology

## 2024-10-21 VITALS — BP 124/88 | HR 66 | Ht 73.25 in | Wt 186.0 lb

## 2024-10-21 DIAGNOSIS — Z951 Presence of aortocoronary bypass graft: Secondary | ICD-10-CM

## 2024-10-21 DIAGNOSIS — F17200 Nicotine dependence, unspecified, uncomplicated: Secondary | ICD-10-CM | POA: Diagnosis not present

## 2024-10-21 DIAGNOSIS — I10 Essential (primary) hypertension: Secondary | ICD-10-CM | POA: Diagnosis not present

## 2024-10-21 DIAGNOSIS — I2510 Atherosclerotic heart disease of native coronary artery without angina pectoris: Secondary | ICD-10-CM

## 2024-10-21 DIAGNOSIS — E1142 Type 2 diabetes mellitus with diabetic polyneuropathy: Secondary | ICD-10-CM

## 2024-10-21 DIAGNOSIS — R0609 Other forms of dyspnea: Secondary | ICD-10-CM

## 2024-10-21 NOTE — Patient Instructions (Signed)
 Medication Instructions:  Your physician recommends that you continue on your current medications as directed. Please refer to the Current Medication list given to you today.  *If you need a refill on your cardiac medications before your next appointment, please call your pharmacy*   Lab Work: None ordered If you have labs (blood work) drawn today and your tests are completely normal, you will receive your results only by: MyChart Message (if you have MyChart) OR A paper copy in the mail If you have any lab test that is abnormal or we need to change your treatment, we will call you to review the results.   Testing/Procedures: You are scheduled for a Myocardial Perfusion Imaging Study.  Please arrive 15 minutes prior to your appointment time for registration and insurance purposes.  The test will take approximately 3 to 4 hours to complete; you may bring reading material.  If someone comes with you to your appointment, they will need to remain in the main lobby due to limited space in the testing area.   How to prepare for your Myocardial Perfusion Test: Do not eat or drink 3 hours prior to your test, except you may have water. Do not consume products containing caffeine (regular or decaffeinated) 12 hours prior to your test. (ex: coffee, chocolate, sodas, tea). Do bring a list of your current medications with you.  If not listed below, you may take your medications as normal. Do not take labetolol (Trandate ) for 24 hours prior to the test.  Bring the medication to your appointment as you may be required to take it once the test is complete. Do wear comfortable clothes (no dresses or overalls) and walking shoes, tennis shoes preferred (No heels or open toe shoes are allowed). Do NOT wear cologne, perfume, aftershave, or lotions (deodorant is allowed). If these instructions are not followed, your test will have to be rescheduled.  If you cannot keep your appointment, please provide 24 hours  notification to the Nuclear Lab, to avoid a possible $50 charge to your account.  Follow-Up: At [facility], you and your health needs are our priority.  As part of our continuing mission to provide you with exceptional heart care, we have created designated Provider Care Teams.  These Care Teams include your primary Cardiologist (physician) and Advanced Practice Providers (APPs -  Physician Assistants and Nurse Practitioners) who all work together to provide you with the care you need, when you need it.  We recommend signing up for the patient portal called MyChart.  Sign up information is provided on this After Visit Summary.  MyChart is used to connect with patients for Virtual Visits (Telemedicine).  Patients are able to view lab/test results, encounter notes, upcoming appointments, etc.  Non-urgent messages can be sent to your provider as well.   To learn more about what you can do with MyChart, go to [email or web address].    Your next appointment:   5 month(s)  Provider:   Lamar Fitch, MD   Other Instructions  Cardiac Nuclear Scan A cardiac nuclear scan is a test that is done to check the flow of blood to your heart. It is done when you are resting and when you are exercising. The test looks for problems such as: Not enough blood reaching a portion of the heart. The heart muscle not working as it should. You may need this test if you have: Heart disease. Lab results that are not normal. Had heart surgery or a balloon procedure to open  up blocked arteries (angioplasty) or a small mesh tube (stent). Chest pain. Shortness of breath. Had a heart attack. In this test, a special dye (tracer) is put into your bloodstream. The tracer will travel to your heart. A camera will then take pictures of your heart to see how the tracer moves through your heart. This test is usually done at a hospital and takes 2-4 hours. Tell a doctor about: Any allergies you have. All  medicines you are taking, including vitamins, herbs, eye drops, creams, and over-the-counter medicines. Any bleeding problems you have. Any surgeries you have had. Any medical conditions you have. Whether you are pregnant or may be pregnant. Any history of asthma or long-term (chronic) lung disease. Any history of heart rhythm disorders or heart valve conditions. What are the risks? Your doctor will talk with you about risks. These may include: Serious chest pain and heart attack. This is only a risk if the stress portion of the test is done. Fast or uneven heartbeats (palpitations). A feeling of warmth in your chest. This feeling usually does not last long. Allergic reaction to the tracer. Shortness of breath or trouble breathing. What happens before the test? Ask your doctor about changing or stopping your normal medicines. Follow instructions from your doctor about what you cannot eat or drink. Remove your jewelry on the day of the test. Ask your doctor if you need to avoid nicotine or caffeine. What happens during the test? An IV tube will be inserted into one of your veins. Your doctor will give you a small amount of tracer through the IV tube. You will wait for 20-40 minutes while the tracer moves through your bloodstream. Your heart will be monitored with an electrocardiogram (ECG). You will lie down on an exam table. Pictures of your heart will be taken for about 15-20 minutes. You may also have a stress test. For this test, one of these things may be done: You will be asked to exercise on a treadmill or a stationary bike. You will be given medicines that will make your heart work harder. This is done if you are unable to exercise. When blood flow to your heart has peaked, a tracer will again be given through the IV tube. After 20-40 minutes, you will get back on the exam table. More pictures will be taken of your heart. Depending on the tracer that is used, more pictures may  need to be taken 3-4 hours later. Your IV tube will be removed when the test is over. The test may vary among doctors and hospitals. What happens after the test? Ask your doctor: Whether you can return to your normal schedule, including diet, activities, travel, and medicines. Whether you should drink more fluids. This will help to remove the tracer from your body. Ask your doctor, or the department that is doing the test: When will my results be ready? How will I get my results? What are my treatment options? What other tests do I need? What are my next steps? This information is not intended to replace advice given to you by your health care provider. Make sure you discuss any questions you have with your health care provider. Document Revised: 03/14/2022 Document Reviewed: 03/14/2022 Elsevier Patient Education  2023 [employer].

## 2024-10-21 NOTE — Telephone Encounter (Signed)
 Pt given instructions for stress test.

## 2024-10-21 NOTE — Telephone Encounter (Signed)
 Copied from CRM #[id number]. Topic: Clinical - Medication Question >> Oct 20, 2024  3:18 PM Victoria A wrote: Reason for CRM: Patient's wife called said that Dr.Redding informed them to ask [facility] doctor about the Tramadol , they asked and was informed that the doctor that wrote prescription should do the refill. Mrs. Warnell says Dr.Redding prescribed medication and she would like a call back regarding medicine please [phone]

## 2024-10-21 NOTE — Progress Notes (Signed)
 " Cardiology Office Note:    Date:  10/21/2024   ID:  Andrew Russo, DOB 08/16/1956, MRN [id number]  PCP:  Dottie Norleen PHEBE PONCE, MD  Cardiologist:  Lamar Fitch, MD    Referring MD: Dottie Norleen PHEBE PONCE, MD   No chief complaint on file. Doing fine  History of Present Illness:    Andrew Russo is a 68 y.o. male past medical history significant for coronary artery disease, status post coronary bypass graft done in 2013 in [place] , diabetes, hyperlipidemia, essential hypertension.  He recently relocated from [place]  would like to be established as a patient in my practice.  Did echocardiogram on him which showed preserved left ventricular ejection fraction, did have carotic ultrasounds which did not show any significant stenosis, he is artery and lower extremities were calcified and noncompressible but duplex did not show critical stenosis.  He denies have any chest pain tightness squeezing pressure mid chest, however, at the same time ability to exercise limited because of orthopedic issues including hip pain as well as back pain.  Sadly he still continues to smoke  Past Medical History:  Diagnosis Date   Anxiety    CAD (coronary artery disease)    f/by [facility]   Diabetes mellitus without complication (HCC)    Dyslipidemia    Hypertension    Medically noncompliant    Osteoarthritis    PE (pulmonary embolism)    Tobacco abuse    for 30+ years    Past Surgical History:  Procedure Laterality Date   COLONOSCOPY N/A    circa 2014 in [place]    neck fusion     REPLACEMENT TOTAL KNEE     SHOULDER SURGERY      Current Medications: Active Medications[1]   Allergies:   Patient has no known allergies.   Social History   Socioeconomic History   Marital status: Married    Spouse name: Not on file   Number of children: Not on file   Years of education: Not on file   Highest education level: Not on file  Occupational History   Occupation: Retired from ship [employer]  Tobacco Use    Smoking status: Every Day    Current packs/day: 0.50    Types: Cigarettes   Smokeless tobacco: Never  Vaping Use   Vaping status: Never Used  Substance and Sexual Activity   Alcohol  use: No   Drug use: Not Currently   Sexual activity: Not on file  Other Topics Concern   Not on file  Social History Narrative   Not on file   Social Drivers of Health   Tobacco Use: High Risk (10/21/2024)   Patient History    Smoking Tobacco Use: Every Day    Smokeless Tobacco Use: Never    Passive Exposure: Not on file  Financial Resource Strain: Not on file  Food Insecurity: Medium Risk (10/09/2023)   Received from [facility]   Epic    Within the past 12 months, you worried that your food would run out before you got money to buy more: Sometimes true    Within the past 12 months, the food you bought just didn't last and you didn't have money to get more. : Sometimes true  Transportation Needs: No Transportation Needs (10/09/2023)   Received from [facility]    In the past 12 months, has lack of reliable transportation kept you from medical appointments, meetings, work or from getting things needed for daily living? : No  Physical  Activity: Not on file  Stress: Not on file  Social Connections: Not on file  Depression (PHQ2-9): Medium Risk (08/07/2024)   Depression (PHQ2-9)    PHQ-2 Score: 5  Alcohol  Screen: Not on file  Housing: Medium Risk (10/09/2023)   Received from [facility]   Epic    What is your living situation today?: I have a steady place to live    Think about the place you live. Do you have problems with any of the following? Choose all that apply:: Mold;Water leaks;Lack of heat  Utilities: Low Risk (10/09/2023)   Received from [facility]   Utilities    In the past 12 months has the electric, gas, oil, or water company threatened to shut off services in your home? : No  Health Literacy: Not on file     Family History: The patient's family  history is not on file. ROS:   Please see the history of present illness.    All 14 point review of systems negative except as described per history of present illness  EKGs/Labs/Other Studies Reviewed:         Recent Labs: 08/07/2024: ALT 14; BUN 21; Creatinine, Ser 0.89; Hemoglobin 14.3; Platelets 332; Potassium 4.8; Sodium 140; TSH 0.885  Recent Lipid Panel    Component Value Date/Time   CHOL 122 08/07/2024 1045   TRIG 81 08/07/2024 1045   HDL 60 08/07/2024 1045   CHOLHDL 2.0 08/07/2024 1045   LDLCALC 46 08/07/2024 1045    Physical Exam:    VS:  BP 124/88   Pulse 66   Ht 6' 1.25 (1.861 m)   Wt 186 lb (84.4 kg)   SpO2 94%   BMI 24.37 kg/m     Wt Readings from Last 3 Encounters:  10/21/24 186 lb (84.4 kg)  09/04/24 191 lb 12.8 oz (87 kg)  08/21/24 193 lb (87.5 kg)     GEN:  Well nourished, well developed in no acute distress HEENT: Normal NECK: No JVD; No carotid bruits LYMPHATICS: No lymphadenopathy CARDIAC: RRR, no murmurs, no rubs, no gallops RESPIRATORY:  Clear to auscultation without rales, wheezing or rhonchi  ABDOMEN: Soft, non-tender, non-distended MUSCULOSKELETAL:  No edema; No deformity  SKIN: Warm and dry LOWER EXTREMITIES: no swelling NEUROLOGIC:  Alert and oriented x 3 PSYCHIATRIC:  Normal affect   ASSESSMENT:    1. Coronary artery disease involving native coronary artery of native heart without angina pectoris   2. Essential hypertension   3. Type 2 diabetes mellitus with peripheral neuropathy (HCC)   4. Smoking addiction   5. Status post coronary artery bypass graft 2013 in [place]    PLAN:    In order of problems listed above:  Coronary disease will schedule to have a stress test make sure he does not have any inducible ischemia he does have fatigue tiredness shortness of breath which could be anginal equivalent in the meantime continue antiplatelet therapy. Dyslipidemia he is taking Lipitor 80 which I will continue I did review [facility] which  show me data from October 2025 LDL 46 HDL 60 continue present management. Essential hypertension blood pressure well-controlled. Smoking huge problem spent at least 5 minutes talking about the significance of all different factors help to quit hopefully he will be able to accomplish that goal. Status post coronary bypass graft doing well   Medication Adjustments/Labs and Tests Ordered: Current medicines are reviewed at length with the patient today.  Concerns regarding medicines are outlined above.  No orders of the defined  types were placed in this encounter.  Medication changes: No orders of the defined types were placed in this encounter.   Signed, Lamar DOROTHA Fitch, MD, [facility] 10/21/2024 11:56 AM    [facility]    [1]  Current Meds  Medication Sig   acetaminophen  (TYLENOL ) 500 MG tablet Take 1,500 mg by mouth every 6 (six) hours as needed for mild pain.   aspirin  EC 81 MG tablet Take 1 tablet (81 mg total) by mouth daily. Swallow whole.   atorvastatin (LIPITOR) 80 MG tablet Take 80 mg by mouth daily.   busPIRone  (BUSPAR ) 10 MG tablet Take 10 mg by mouth 3 (three) times daily.   FARXIGA  10 MG TABS tablet Take 1 tablet (10 mg total) by mouth daily.   furosemide (LASIX) 40 MG tablet Take 40 mg by mouth daily.   Insulin  Glargine (BASAGLAR  KWIKPEN) 100 UNIT/ML INJECT 30 UNITS SUBCUTANEOUSLY ONCE DAILY Subcutaneous for 50 Days   labetalol  (NORMODYNE ) 100 MG tablet Take 1 tablet (100 mg total) by mouth 2 (two) times daily.   losartan  (COZAAR ) 100 MG tablet Take 1 tablet (100 mg total) by mouth daily.   metFORMIN  (GLUCOPHAGE ) 500 MG tablet Take 2 tablets (1,000 mg total) by mouth 2 (two) times daily with a meal.   venlafaxine  XR (EFFEXOR -XR) 150 MG 24 hr capsule Take 1 capsule (150 mg total) by mouth daily with breakfast.   "

## 2024-10-27 ENCOUNTER — Other Ambulatory Visit (HOSPITAL_BASED_OUTPATIENT_CLINIC_OR_DEPARTMENT_OTHER): Payer: Self-pay | Admitting: Family Medicine

## 2024-10-27 ENCOUNTER — Other Ambulatory Visit (HOSPITAL_BASED_OUTPATIENT_CLINIC_OR_DEPARTMENT_OTHER): Payer: Self-pay

## 2024-10-27 DIAGNOSIS — E782 Mixed hyperlipidemia: Secondary | ICD-10-CM

## 2024-10-27 DIAGNOSIS — R600 Localized edema: Secondary | ICD-10-CM

## 2024-10-27 MED ORDER — ATORVASTATIN CALCIUM 80 MG PO TABS
80.0000 mg | ORAL_TABLET | Freq: Every day | ORAL | 1 refills | Status: AC
  Filled 2024-10-27: qty 90, 90d supply, fill #0

## 2024-10-27 MED ORDER — FUROSEMIDE 40 MG PO TABS
40.0000 mg | ORAL_TABLET | Freq: Every day | ORAL | 1 refills | Status: AC
  Filled 2024-10-27: qty 90, 90d supply, fill #0

## 2024-10-28 ENCOUNTER — Ambulatory Visit: Attending: Cardiology

## 2024-10-28 DIAGNOSIS — I2510 Atherosclerotic heart disease of native coronary artery without angina pectoris: Secondary | ICD-10-CM

## 2024-10-28 DIAGNOSIS — R0609 Other forms of dyspnea: Secondary | ICD-10-CM | POA: Diagnosis not present

## 2024-10-28 LAB — MYOCARDIAL PERFUSION IMAGING
LV dias vol: 94 mL (ref 62–150)
LV sys vol: 50 mL
Nuc Stress EF: 47 %
Peak HR: 99 {beats}/min
Rest HR: 100 {beats}/min
Rest Nuclear Isotope Dose: 10.7 mCi
SDS: 7
SRS: 9
SSS: 16
Stress Nuclear Isotope Dose: 28.5 mCi
TID: 0.97

## 2024-10-28 MED ORDER — REGADENOSON 0.4 MG/5ML IV SOLN
0.4000 mg | Freq: Once | INTRAVENOUS | Status: AC
  Administered 2024-10-28: 0.4 mg via INTRAVENOUS

## 2024-10-28 MED ORDER — TECHNETIUM TC 99M TETROFOSMIN IV KIT
28.4000 | PACK | Freq: Once | INTRAVENOUS | Status: AC | PRN
  Administered 2024-10-28: 28.4 via INTRAVENOUS

## 2024-10-28 MED ORDER — TECHNETIUM TC 99M TETROFOSMIN IV KIT
10.7000 | PACK | Freq: Once | INTRAVENOUS | Status: AC | PRN
  Administered 2024-10-28: 10.7 via INTRAVENOUS

## 2024-11-03 ENCOUNTER — Ambulatory Visit: Payer: Self-pay | Admitting: Cardiology

## 2024-11-05 NOTE — Telephone Encounter (Signed)
 Patient scheduled for appointment to discuss stress test on 11/12/24.  Alan, RN

## 2024-11-11 DIAGNOSIS — Z720 Tobacco use: Secondary | ICD-10-CM | POA: Insufficient documentation

## 2024-11-11 DIAGNOSIS — E119 Type 2 diabetes mellitus without complications: Secondary | ICD-10-CM | POA: Insufficient documentation

## 2024-11-11 DIAGNOSIS — M1990 Unspecified osteoarthritis, unspecified site: Secondary | ICD-10-CM | POA: Insufficient documentation

## 2024-11-11 DIAGNOSIS — F419 Anxiety disorder, unspecified: Secondary | ICD-10-CM | POA: Insufficient documentation

## 2024-11-11 DIAGNOSIS — Z91199 Patient's noncompliance with other medical treatment and regimen due to unspecified reason: Secondary | ICD-10-CM | POA: Insufficient documentation

## 2024-11-11 DIAGNOSIS — E785 Hyperlipidemia, unspecified: Secondary | ICD-10-CM | POA: Insufficient documentation

## 2024-11-11 DIAGNOSIS — I10 Essential (primary) hypertension: Secondary | ICD-10-CM | POA: Insufficient documentation

## 2024-11-12 ENCOUNTER — Other Ambulatory Visit (HOSPITAL_BASED_OUTPATIENT_CLINIC_OR_DEPARTMENT_OTHER): Payer: Self-pay

## 2024-11-12 ENCOUNTER — Encounter: Payer: Self-pay | Admitting: Cardiology

## 2024-11-12 ENCOUNTER — Ambulatory Visit: Attending: Cardiology | Admitting: Cardiology

## 2024-11-12 VITALS — BP 140/90 | HR 80 | Ht 71.0 in | Wt 186.0 lb

## 2024-11-12 DIAGNOSIS — E785 Hyperlipidemia, unspecified: Secondary | ICD-10-CM

## 2024-11-12 DIAGNOSIS — Z951 Presence of aortocoronary bypass graft: Secondary | ICD-10-CM

## 2024-11-12 DIAGNOSIS — E1142 Type 2 diabetes mellitus with diabetic polyneuropathy: Secondary | ICD-10-CM | POA: Diagnosis not present

## 2024-11-12 DIAGNOSIS — F17200 Nicotine dependence, unspecified, uncomplicated: Secondary | ICD-10-CM

## 2024-11-12 DIAGNOSIS — I10 Essential (primary) hypertension: Secondary | ICD-10-CM

## 2024-11-12 DIAGNOSIS — I2510 Atherosclerotic heart disease of native coronary artery without angina pectoris: Secondary | ICD-10-CM | POA: Diagnosis not present

## 2024-11-12 MED ORDER — AMLODIPINE BESYLATE 2.5 MG PO TABS
2.5000 mg | ORAL_TABLET | Freq: Every day | ORAL | 3 refills | Status: AC
  Filled 2024-11-12: qty 90, 90d supply, fill #0

## 2024-11-12 NOTE — Progress Notes (Signed)
 " Cardiology Office Note:    Date:  11/12/2024   ID:  Andrew Russo, DOB 04/04/1956, MRN [id number]  PCP:  Dottie Norleen PHEBE PONCE, MD  Cardiologist:  Lamar Fitch, MD    Referring MD: Dottie Norleen PHEBE PONCE, MD   Chief Complaint  Patient presents with   Follow-up  Doing fine  History of Present Illness:    Andrew Russo is a 69 y.o. male past medical history significant for coronary disease, status post coronary bypass graft done in 2013 in [place] , diabetes, hyperlipidemia, essential hypertension.  Smoking.  Comes today to months for follow-up he did have a stress test which showed small area of ischemia involving apical midportion of the inferior septal wall.  He does not have any symptoms he is able to exercise limited because of severe arthritis he does have his multiple joints.  He denies having any chest pain tightness squeezing pressure burning chest.  I brought him today to discuss that  Past Medical History:  Diagnosis Date   Anxiety    BPPV (benign paroxysmal positional vertigo) 08/07/2024   CAD (coronary artery disease)    f/by [facility]   Chronic bilateral low back pain without sciatica 02/29/2024   Chronic pain 09/04/2024   Diabetes mellitus without complication (HCC)    Disorder of left cervical nerve root 03/21/2024   Dyslipidemia    Essential hypertension 02/18/2017   Hardening of the aorta (main artery of the heart) 08/26/2024   Hip pain, bilateral 08/07/2024   HLD (hyperlipidemia) 02/18/2017   Hypertension    Hyponatremia 02/18/2017   Lumbar post-laminectomy syndrome 08/26/2024   Lumbar radiculopathy 08/26/2024   Medically noncompliant    Neck pain 02/29/2024   Osteoarthritis    Osteoarthritis of right hip 09/16/2024   Osteoarthritis of right knee 09/16/2024   Pain of right hip joint 09/16/2024   Pernicious anemia 09/04/2024   Polyneuropathy in diseases classified elsewhere 08/26/2024   Smoking addiction 08/21/2024   Status post coronary artery bypass  graft 2013 in [place] 08/21/2024   Tobacco abuse    for 30+ years   Type 2 diabetes mellitus with peripheral neuropathy (HCC) 02/18/2017    Past Surgical History:  Procedure Laterality Date   COLONOSCOPY N/A    circa 2014 in [place]    neck fusion     REPLACEMENT TOTAL KNEE     SHOULDER SURGERY      Current Medications: Active Medications[1]   Allergies:   Patient has no known allergies.   Social History   Socioeconomic History   Marital status: Married    Spouse name: Not on file   Number of children: Not on file   Years of education: Not on file   Highest education level: Not on file  Occupational History   Occupation: Retired from ship yard  Tobacco Use   Smoking status: Every Day    Current packs/day: 0.50    Types: Cigarettes   Smokeless tobacco: Never  Vaping Use   Vaping status: Never Used  Substance and Sexual Activity   Alcohol  use: No   Drug use: Not Currently   Sexual activity: Not on file  Other Topics Concern   Not on file  Social History Narrative   Not on file   Social Drivers of Health   Tobacco Use: High Risk (11/12/2024)   Patient History    Smoking Tobacco Use: Every Day    Smokeless Tobacco Use: Never    Passive Exposure: Not on file  Financial Resource Strain: Not  on file  Food Insecurity: Medium Risk (10/09/2023)   Received from [facility]   Epic    Within the past 12 months, you worried that your food would run out before you got money to buy more: Sometimes true    Within the past 12 months, the food you bought just didn't last and you didn't have money to get more. : Sometimes true  Transportation Needs: No Transportation Needs (10/09/2023)   Received from [facility]    In the past 12 months, has lack of reliable transportation kept you from medical appointments, meetings, work or from getting things needed for daily living? : No  Physical Activity: Not on file  Stress: Not on file  Social Connections: Not on  file  Depression (PHQ2-9): Medium Risk (08/07/2024)   Depression (PHQ2-9)    PHQ-2 Score: 5  Alcohol  Screen: Not on file  Housing: Medium Risk (10/09/2023)   Received from [facility]   Epic    What is your living situation today?: I have a steady place to live    Think about the place you live. Do you have problems with any of the following? Choose all that apply:: Mold;Water leaks;Lack of heat  Utilities: Low Risk (10/09/2023)   Received from [facility]   Utilities    In the past 12 months has the electric, gas, oil, or water company threatened to shut off services in your home? : No  Health Literacy: Not on file     Family History: The patient's family history includes Cancer in his father; Diabetes in his brother, brother, maternal grandfather, maternal grandmother, mother, paternal grandfather, and paternal grandmother; Gout in his mother; Heart attack in his father; Heart failure in his father; Multiple sclerosis in his sister. ROS:   Please see the history of present illness.    All 14 point review of systems negative except as described per history of present illness  EKGs/Labs/Other Studies Reviewed:         Recent Labs: 08/07/2024: ALT 14; BUN 21; Creatinine, Ser 0.89; Hemoglobin 14.3; Platelets 332; Potassium 4.8; Sodium 140; TSH 0.885  Recent Lipid Panel    Component Value Date/Time   CHOL 122 08/07/2024 1045   TRIG 81 08/07/2024 1045   HDL 60 08/07/2024 1045   CHOLHDL 2.0 08/07/2024 1045   LDLCALC 46 08/07/2024 1045    Physical Exam:    VS:  BP (!) 140/90   Pulse 80   Ht 5' 11 (1.803 m)   Wt 186 lb (84.4 kg)   SpO2 93%   BMI 25.94 kg/m     Wt Readings from Last 3 Encounters:  11/12/24 186 lb (84.4 kg)  10/28/24 186 lb (84.4 kg)  10/21/24 186 lb (84.4 kg)     GEN:  Well nourished, well developed in no acute distress HEENT: Normal NECK: No JVD; No carotid bruits LYMPHATICS: No lymphadenopathy CARDIAC: RRR, no murmurs, no rubs, no  gallops RESPIRATORY:  Clear to auscultation without rales, wheezing or rhonchi  ABDOMEN: Soft, non-tender, non-distended MUSCULOSKELETAL:  No edema; No deformity  SKIN: Warm and dry LOWER EXTREMITIES: no swelling NEUROLOGIC:  Alert and oriented x 3 PSYCHIATRIC:  Normal affect   ASSESSMENT:    1. Coronary artery disease involving native coronary artery of native heart without angina pectoris   2. Essential hypertension   3. Type 2 diabetes mellitus with peripheral neuropathy (HCC)   4. Smoking addiction   5. Dyslipidemia   6. Status post coronary artery  bypass graft 2013 in [place]    PLAN:    In order of problems listed above:  Coronary disease mild ischemia seen on stress test but he is completely asymptomatic with limited mobility because of chronic arthritis.  I will add antianginal therapy I will put you on amlodipine  2.5 daily, I encouraged him to be [place] active also encouraged him to let me know if he develop any chest pain tightness squeezing pressure burning chest. Essential hypertension still not perfectly controlled addition of amlodipine  should help. Type 2 diabetes followed by internal medicine team apparently stable. Smoking which is still huge problem he smokes at least 1 pack/day he does have yellow fingers when I see him in the room.  Will of course advised him to quit smoking he understand he is trying to work and then he said he smokes because he is bored Dyslipidemia I did review [facility] which show me his LDL 46 HDL 60 excellent control continue present management with high intensity statin form of Lipitor 80.   Medication Adjustments/Labs and Tests Ordered: Current medicines are reviewed at length with the patient today.  Concerns regarding medicines are outlined above.  No orders of the defined types were placed in this encounter.  Medication changes:  Meds ordered this encounter  Medications   amLODipine  (NORVASC ) 2.5 MG tablet    Sig: Take 1 tablet (2.5 mg  total) by mouth daily.    Dispense:  90 tablet    Refill:  3    Signed, Lamar DOROTHA Fitch, MD, [facility] 11/12/2024 10:10 AM     [facility]    [1]  Current Meds  Medication Sig   acetaminophen  (TYLENOL ) 500 MG tablet Take 1,500 mg by mouth every 6 (six) hours as needed for mild pain.   amLODipine  (NORVASC ) 2.5 MG tablet Take 1 tablet (2.5 mg total) by mouth daily.   aspirin  EC 81 MG tablet Take 1 tablet (81 mg total) by mouth daily. Swallow whole.   atorvastatin  (LIPITOR) 80 MG tablet Take 1 tablet (80 mg total) by mouth daily.   busPIRone  (BUSPAR ) 10 MG tablet Take 10 mg by mouth 3 (three) times daily.   FARXIGA  10 MG TABS tablet Take 1 tablet (10 mg total) by mouth daily.   furosemide  (LASIX ) 40 MG tablet Take 1 tablet (40 mg total) by mouth daily.   Insulin  Glargine (BASAGLAR  KWIKPEN) 100 UNIT/ML INJECT 30 UNITS SUBCUTANEOUSLY ONCE DAILY Subcutaneous for 50 Days   labetalol  (NORMODYNE ) 100 MG tablet Take 1 tablet (100 mg total) by mouth 2 (two) times daily.   losartan  (COZAAR ) 100 MG tablet Take 1 tablet (100 mg total) by mouth daily.   metFORMIN  (GLUCOPHAGE ) 500 MG tablet Take 2 tablets (1,000 mg total) by mouth 2 (two) times daily with a meal.   venlafaxine  XR (EFFEXOR -XR) 150 MG 24 hr capsule Take 1 capsule (150 mg total) by mouth daily with breakfast.   "

## 2024-11-12 NOTE — Patient Instructions (Signed)
 Medication Instructions:   START: Amlodipine 2.5mg  1 tablet daily   Lab Work: None Ordered If you have labs (blood work) drawn today and your tests are completely normal, you will receive your results only by: MyChart Message (if you have MyChart) OR A paper copy in the mail If you have any lab test that is abnormal or we need to change your treatment, we will call you to review the results.   Testing/Procedures: None Ordered   Follow-Up: At [facility], you and your health needs are our priority.  As part of our continuing mission to provide you with exceptional heart care, we have created designated Provider Care Teams.  These Care Teams include your primary Cardiologist (physician) and Advanced Practice Providers (APPs -  Physician Assistants and Nurse Practitioners) who all work together to provide you with the care you need, when you need it.  We recommend signing up for the patient portal called MyChart.  Sign up information is provided on this After Visit Summary.  MyChart is used to connect with patients for Virtual Visits (Telemedicine).  Patients are able to view lab/test results, encounter notes, upcoming appointments, etc.  Non-urgent messages can be sent to your provider as well.   To learn more about what you can do with MyChart, go to [email or web address].    Your next appointment:   4 month(s)  The format for your next appointment:   In Person  Provider:   Lamar Fitch, MD    Other Instructions NA

## 2024-11-19 ENCOUNTER — Other Ambulatory Visit (HOSPITAL_BASED_OUTPATIENT_CLINIC_OR_DEPARTMENT_OTHER): Payer: Self-pay | Admitting: Family Medicine

## 2024-11-19 ENCOUNTER — Other Ambulatory Visit (HOSPITAL_BASED_OUTPATIENT_CLINIC_OR_DEPARTMENT_OTHER): Payer: Self-pay

## 2024-11-19 DIAGNOSIS — E1142 Type 2 diabetes mellitus with diabetic polyneuropathy: Secondary | ICD-10-CM

## 2024-11-19 MED ORDER — FARXIGA 10 MG PO TABS
10.0000 mg | ORAL_TABLET | Freq: Every day | ORAL | 0 refills | Status: AC
  Filled 2024-11-19: qty 90, 90d supply, fill #0

## 2024-12-02 ENCOUNTER — Other Ambulatory Visit (HOSPITAL_BASED_OUTPATIENT_CLINIC_OR_DEPARTMENT_OTHER): Payer: Self-pay

## 2024-12-03 ENCOUNTER — Other Ambulatory Visit (HOSPITAL_BASED_OUTPATIENT_CLINIC_OR_DEPARTMENT_OTHER): Payer: Self-pay

## 2024-12-04 ENCOUNTER — Other Ambulatory Visit (HOSPITAL_BASED_OUTPATIENT_CLINIC_OR_DEPARTMENT_OTHER): Payer: Self-pay

## 2024-12-04 ENCOUNTER — Encounter (HOSPITAL_BASED_OUTPATIENT_CLINIC_OR_DEPARTMENT_OTHER): Payer: Self-pay | Admitting: Pharmacy Technician

## 2024-12-04 ENCOUNTER — Other Ambulatory Visit (HOSPITAL_BASED_OUTPATIENT_CLINIC_OR_DEPARTMENT_OTHER): Payer: Self-pay | Admitting: Family Medicine

## 2024-12-05 ENCOUNTER — Ambulatory Visit (HOSPITAL_BASED_OUTPATIENT_CLINIC_OR_DEPARTMENT_OTHER): Admitting: Family Medicine

## 2024-12-05 ENCOUNTER — Other Ambulatory Visit (HOSPITAL_BASED_OUTPATIENT_CLINIC_OR_DEPARTMENT_OTHER): Payer: Self-pay

## 2024-12-05 ENCOUNTER — Encounter (HOSPITAL_BASED_OUTPATIENT_CLINIC_OR_DEPARTMENT_OTHER): Payer: Self-pay | Admitting: Family Medicine

## 2024-12-05 VITALS — BP 142/84 | HR 81 | Temp 97.8°F | Resp 17 | Wt 185.1 lb

## 2024-12-05 DIAGNOSIS — E785 Hyperlipidemia, unspecified: Secondary | ICD-10-CM

## 2024-12-05 DIAGNOSIS — E1165 Type 2 diabetes mellitus with hyperglycemia: Secondary | ICD-10-CM

## 2024-12-05 DIAGNOSIS — R5383 Other fatigue: Secondary | ICD-10-CM

## 2024-12-05 DIAGNOSIS — Z125 Encounter for screening for malignant neoplasm of prostate: Secondary | ICD-10-CM

## 2024-12-05 DIAGNOSIS — G8929 Other chronic pain: Secondary | ICD-10-CM

## 2024-12-05 MED ORDER — TRAMADOL HCL 50 MG PO TABS
50.0000 mg | ORAL_TABLET | Freq: Two times a day (BID) | ORAL | 0 refills | Status: AC | PRN
  Filled 2024-12-05: qty 42, 21d supply, fill #0

## 2024-12-05 MED ORDER — LABETALOL HCL 100 MG PO TABS
100.0000 mg | ORAL_TABLET | Freq: Two times a day (BID) | ORAL | 0 refills | Status: AC
  Filled 2024-12-05: qty 180, 90d supply, fill #0

## 2024-12-05 NOTE — Progress Notes (Signed)
 "  Established Patient Office Visit  Subjective   Patient ID: Andrew Russo, male    DOB: [birth year]  Age: 69 y.o. MRN: [id number]  Chief Complaint  Patient presents with   physical    Physical    Discussed the use of AI scribe software for clinical note transcription with the patient, who gave verbal consent to proceed.  History of Present Illness Andrew Russo is a 69 year old male with chronic pain and hypertension who presents for pain management and medication review.  He has chronic pain primarily in his back and right hip. He received injections at [facility], which provided relief for five to six days before the pain returned. He has decided not to follow up with them.  Its unclear what happened to my previous referral to [facility] for a second opinion.  He has a history of falls over the past four to five years, exacerbating his pain. 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 He also reports issues with his C8 ulnar nerve root, which he describes as having 'ridden havoc'.  He has hypertension and is currently taking amlodipine , recently started, and is considering increasing the dose from 2.5 mg to 5 mg based on home blood pressure readings. He also takes labetalol , for which he received a refill.  He was previously on gabapentin  at a dose of 2400 mg, but it is no longer effective. He has found some relief with tramadol , which he has used in the past.    Past Medical History:  Diagnosis Date   Anxiety    CAD (coronary artery disease)    f/by [facility]   Chronic bilateral low back pain without sciatica 02/29/2024   Known to [facility]   Chronic pain 09/04/2024   Dyslipidemia    Essential hypertension 02/18/2017   HLD (hyperlipidemia) 02/18/2017   Medically noncompliant    Osteoarthritis    Pernicious anemia 09/04/2024   Tobacco abuse    for 30+ years   Type 2 diabetes mellitus with peripheral  neuropathy (HCC) 02/18/2017    Outpatient Encounter Medications as of 12/05/2024  Medication Sig   amLODipine  (NORVASC ) 2.5 MG tablet Take 1 tablet (2.5 mg total) by mouth daily.   aspirin  EC 81 MG tablet Take 1 tablet (81 mg total) by mouth daily. Swallow whole.   atorvastatin  (LIPITOR) 80 MG tablet Take 1 tablet (80 mg total) by mouth daily.   busPIRone  (BUSPAR ) 10 MG tablet Take 10 mg by mouth 3 (three) times daily.   FARXIGA  10 MG TABS tablet Take 1 tablet (10 mg total) by mouth daily.   furosemide  (LASIX ) 40 MG tablet Take 1 tablet (40 mg total) by mouth daily.   Insulin  Glargine (BASAGLAR  KWIKPEN) 100 UNIT/ML INJECT 30 UNITS SUBCUTANEOUSLY ONCE DAILY Subcutaneous for 50 Days   losartan  (COZAAR ) 100 MG tablet Take 1 tablet (100 mg total) by mouth daily.   metFORMIN  (GLUCOPHAGE ) 500 MG tablet Take 2 tablets (1,000 mg total) by mouth 2 (two) times daily with a meal.   traMADol  (ULTRAM ) 50 MG tablet Take 1 tablet (50 mg total) by mouth every 12 (twelve) hours as needed for up to 21 days.   venlafaxine  XR (EFFEXOR -XR) 150 MG 24 hr capsule Take 1 capsule (150 mg total) by mouth daily with breakfast.   [DISCONTINUED] labetalol  (NORMODYNE ) 100 MG tablet Take 1 tablet (100 mg total) by mouth 2 (two) times daily.  labetalol  (NORMODYNE ) 100 MG tablet Take 1 tablet (100 mg total) by mouth 2 (two) times daily.   [DISCONTINUED] acetaminophen  (TYLENOL ) 500 MG tablet Take 1,500 mg by mouth every 6 (six) hours as needed for mild pain.   No facility-administered encounter medications on file as of 12/05/2024.    Social History   Tobacco Use   Smoking status: Every Day    Current packs/day: 0.50    Types: Cigarettes   Smokeless tobacco: Never  Vaping Use   Vaping status: Never Used  Substance Use Topics   Alcohol  use: No   Drug use: Not Currently      Review of Systems  Constitutional:  Negative for diaphoresis, fever, malaise/fatigue and weight loss.  Respiratory:  Negative for cough,  shortness of breath and wheezing.   Cardiovascular:  Negative for chest pain, palpitations, orthopnea, claudication, leg swelling and PND.  Musculoskeletal:  Positive for back pain, falls and joint pain.  Neurological:  Negative for focal weakness, weakness and headaches.      Objective:     BP (!) 142/84 (Cuff Size: Normal)   Pulse 81   Temp 97.8 F (36.6 C) (Oral)   Resp 17   Wt 185 lb 1.6 oz (84 kg)   SpO2 96%   BMI 25.82 kg/m    Physical Exam Constitutional:      General: He is not in acute distress.    Appearance: Normal appearance.  HENT:     Head: Normocephalic.  Cardiovascular:     Rate and Rhythm: Normal rate and regular rhythm.     Pulses: Normal pulses.     Heart sounds: Normal heart sounds.  Pulmonary:     Effort: Pulmonary effort is normal.     Breath sounds: Normal breath sounds.  Abdominal:     General: Bowel sounds are normal.     Palpations: Abdomen is soft.  Musculoskeletal:     Cervical back: Neck supple. No tenderness.     Right lower leg: No edema.     Left lower leg: No edema.     Comments: Mild diffuse spine pain  Neurological:     General: No focal deficit present.     Mental Status: He is alert.      No results found for any visits on 12/05/24.    The ASCVD Risk score (Arnett DK, et al., 2019) failed to calculate for the following reasons:   Risk score cannot be calculated because patient has a medical history suggesting prior/existing ASCVD   * - Cholesterol units were assumed    Assessment & Plan:   Assessment & Plan Chronic midline low back pain, unspecified whether sciatica present Persists despite previous interventions, including injections in the back and right hip, which provided relief for only five to six days. He is not currently under the care of a spine specialist and has expressed dissatisfaction with previous care at [facility]. He has been on gabapentin  in the past without relief and currently uses tramadol  for  pain management. He is interested in seeing a new spine specialist group for comprehensive management, including potential surgical options. - Referred to [facility] specialist group for comprehensive management of back pain. - Continue tramadol  for pain management for two to three weeks. Orders:   Ambulatory referral to Orthopedic Surgery   traMADol  (ULTRAM ) 50 MG tablet; Take 1 tablet (50 mg total) by mouth every 12 (twelve) hours as needed for up to 21 days.  Type 2 diabetes mellitus with hyperglycemia,  unspecified whether long term insulin  use (HCC) Type 2 diabetes mellitus with recent hyperglycemia. Blood work is needed to assess current glycemic control. - Ordered blood work to assess glycemic control. - His CPE is rescheduled until he is more comfortable. Orders:   Hemoglobin A1c   CBC with Differential/Platelet   Comprehensive metabolic panel with GFR  Dyslipidemia Low fat diet. Orders:   Lipid panel  Fatigue, unspecified type Mild, obtain labs. Orders:   Vitamin B12  Prostate cancer screening  Orders:   PSA    Return in about 3 months (around 03/04/2025) for physical.    REDDING PONCE NORLEEN FALCON., MD  "

## 2024-12-05 NOTE — Assessment & Plan Note (Addendum)
 Low fat diet. Orders:   Lipid panel

## 2025-03-04 ENCOUNTER — Encounter (HOSPITAL_BASED_OUTPATIENT_CLINIC_OR_DEPARTMENT_OTHER): Admitting: Family Medicine

## 2025-03-12 ENCOUNTER — Ambulatory Visit: Admitting: Cardiology
